# Patient Record
Sex: Male | Born: 1955 | Race: White | Hispanic: No | Marital: Married | Smoking: Never smoker
Health system: Southern US, Community
[De-identification: ages and names within clinical notes are randomized; demographics above are authoritative.]

## PROBLEM LIST (undated history)

## (undated) DIAGNOSIS — Z87442 Personal history of urinary calculi: Secondary | ICD-10-CM

## (undated) DIAGNOSIS — E039 Hypothyroidism, unspecified: Secondary | ICD-10-CM

## (undated) DIAGNOSIS — E785 Hyperlipidemia, unspecified: Secondary | ICD-10-CM

## (undated) DIAGNOSIS — E559 Vitamin D deficiency, unspecified: Secondary | ICD-10-CM

## (undated) DIAGNOSIS — E119 Type 2 diabetes mellitus without complications: Secondary | ICD-10-CM

## (undated) DIAGNOSIS — E782 Mixed hyperlipidemia: Secondary | ICD-10-CM

## (undated) DIAGNOSIS — F419 Anxiety disorder, unspecified: Secondary | ICD-10-CM

## (undated) DIAGNOSIS — Z860100 Personal history of colon polyps, unspecified: Secondary | ICD-10-CM

## (undated) DIAGNOSIS — G20A1 Parkinson's disease without dyskinesia, without mention of fluctuations: Secondary | ICD-10-CM

## (undated) DIAGNOSIS — M1711 Unilateral primary osteoarthritis, right knee: Secondary | ICD-10-CM

## (undated) DIAGNOSIS — G20 Parkinson's disease: Secondary | ICD-10-CM

## (undated) HISTORY — DX: Personal history of colonic polyps: Z86.010

## (undated) HISTORY — DX: Hypothyroidism, unspecified: E03.9

## (undated) HISTORY — DX: Type 2 diabetes mellitus without complications: E11.9

## (undated) HISTORY — DX: Mixed hyperlipidemia: E78.2

## (undated) HISTORY — DX: Personal history of colon polyps, unspecified: Z86.0100

## (undated) HISTORY — DX: Parkinson's disease: G20

## (undated) HISTORY — DX: Hyperlipidemia, unspecified: E78.5

## (undated) HISTORY — DX: Unilateral primary osteoarthritis, right knee: M17.11

## (undated) HISTORY — DX: Parkinson's disease without dyskinesia, without mention of fluctuations: G20.A1

## (undated) HISTORY — DX: Vitamin D deficiency, unspecified: E55.9

---

## 1994-11-08 HISTORY — PX: RHINOPLASTY: SUR1284

## 1998-11-08 HISTORY — PX: CARPAL TUNNEL RELEASE: SHX101

## 2003-11-09 HISTORY — PX: HEMORRHOID SURGERY: SHX153

## 2004-11-08 HISTORY — PX: KNEE SURGERY: SHX244

## 2004-11-08 HISTORY — PX: ROTATOR CUFF REPAIR: SHX139

## 2007-11-09 HISTORY — PX: ROTATOR CUFF REPAIR: SHX139

## 2010-11-08 HISTORY — PX: TESTICLE REMOVAL: SHX68

## 2013-11-08 HISTORY — PX: HEMORRHOID SURGERY: SHX153

## 2014-06-26 DIAGNOSIS — M4722 Other spondylosis with radiculopathy, cervical region: Secondary | ICD-10-CM

## 2014-06-26 HISTORY — DX: Other spondylosis with radiculopathy, cervical region: M47.22

## 2019-08-29 ENCOUNTER — Other Ambulatory Visit: Payer: Self-pay | Admitting: Neurosurgery

## 2019-08-29 DIAGNOSIS — M5412 Radiculopathy, cervical region: Secondary | ICD-10-CM

## 2019-09-11 ENCOUNTER — Other Ambulatory Visit: Payer: Self-pay

## 2019-10-09 ENCOUNTER — Ambulatory Visit
Admission: RE | Admit: 2019-10-09 | Discharge: 2019-10-09 | Disposition: A | Discharge status: Home / Self Care | Payer: BC Managed Care – PPO | Source: Ambulatory Visit | Attending: Family Medicine | Admitting: Family Medicine

## 2019-10-09 ENCOUNTER — Ambulatory Visit
Admission: RE | Admit: 2019-10-09 | Discharge: 2019-10-09 | Disposition: A | Discharge status: Home / Self Care | Payer: BC Managed Care – PPO | Source: Ambulatory Visit | Attending: Neurosurgery | Admitting: Neurosurgery

## 2019-10-09 ENCOUNTER — Other Ambulatory Visit: Payer: Self-pay | Admitting: Family Medicine

## 2019-10-09 DIAGNOSIS — Z77018 Contact with and (suspected) exposure to other hazardous metals: Secondary | ICD-10-CM

## 2019-10-09 DIAGNOSIS — M5412 Radiculopathy, cervical region: Secondary | ICD-10-CM

## 2019-10-09 IMAGING — CR DG ORBITS FOR FOREIGN BODY
2 series · 2 of 2 positions shown · non-contrast
Comparison: None.

CLINICAL DATA: Metal working/exposure; clearance prior to MRI

EXAM:
ORBITS FOR FOREIGN BODY - 2 VIEW

[w orbit pa (1 of 2)]
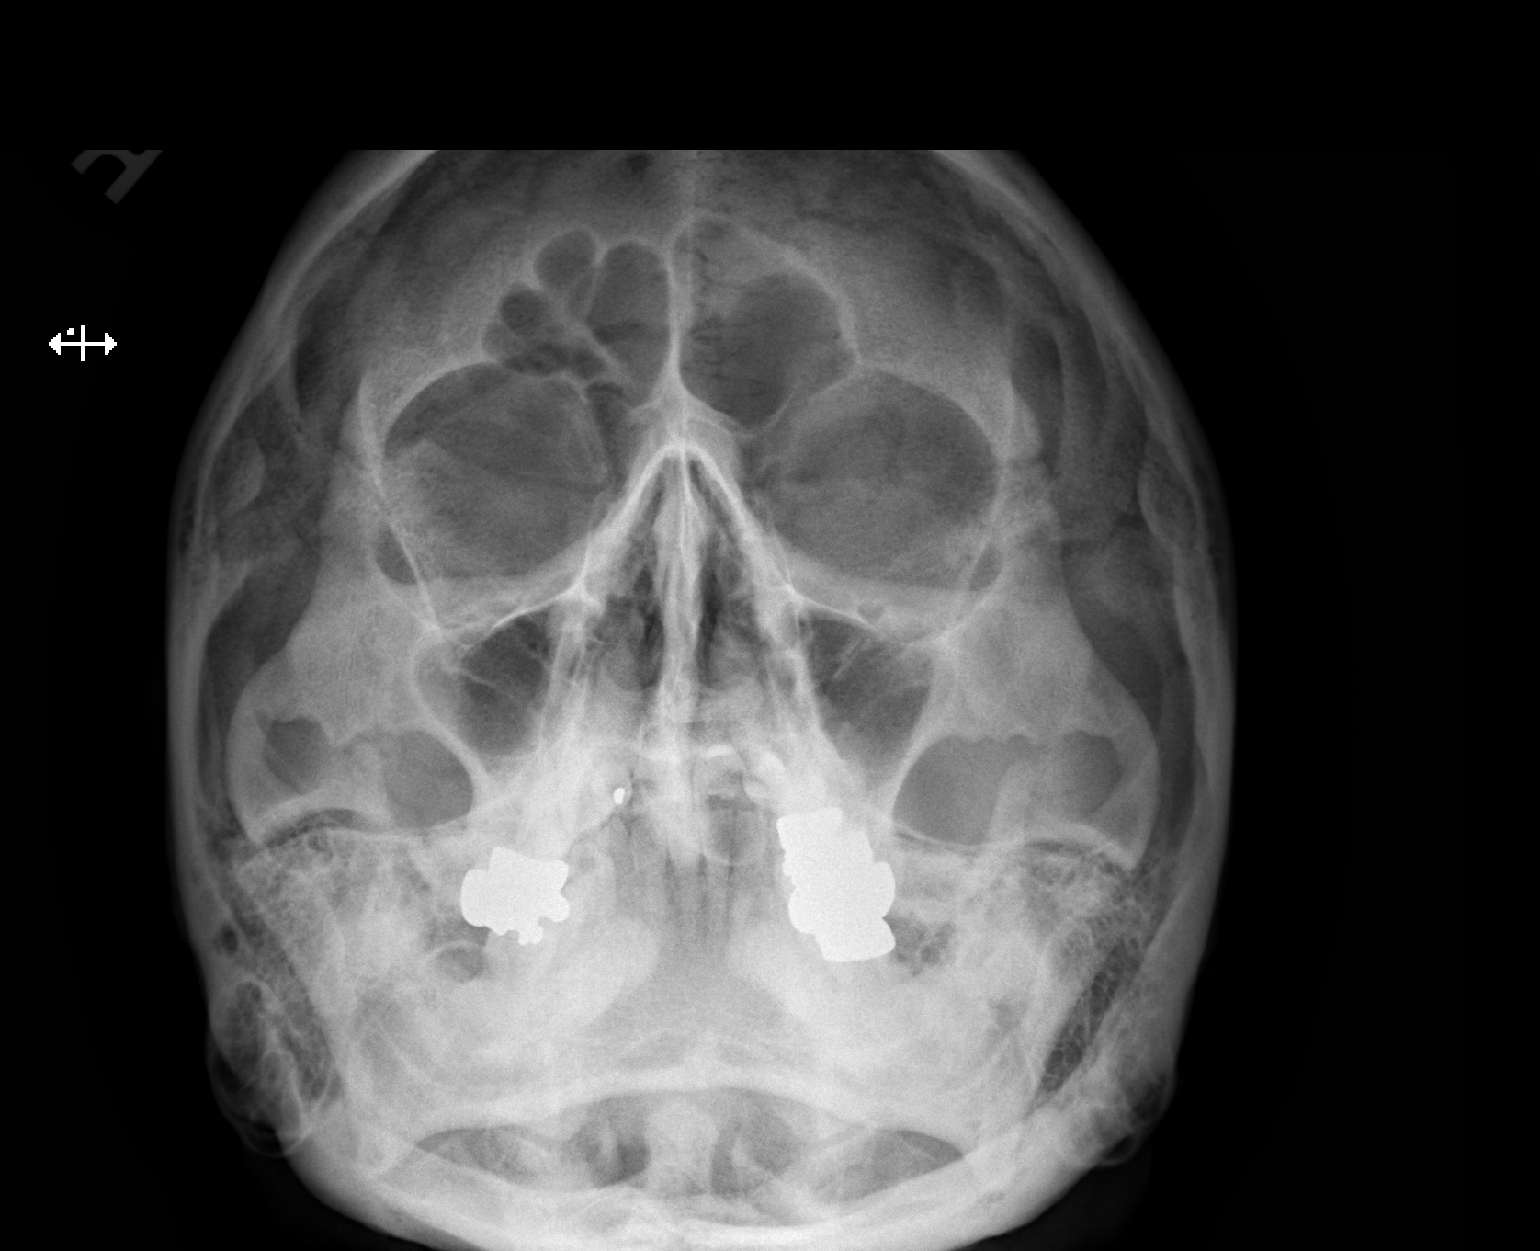

[w orbit pa (2 of 2)]
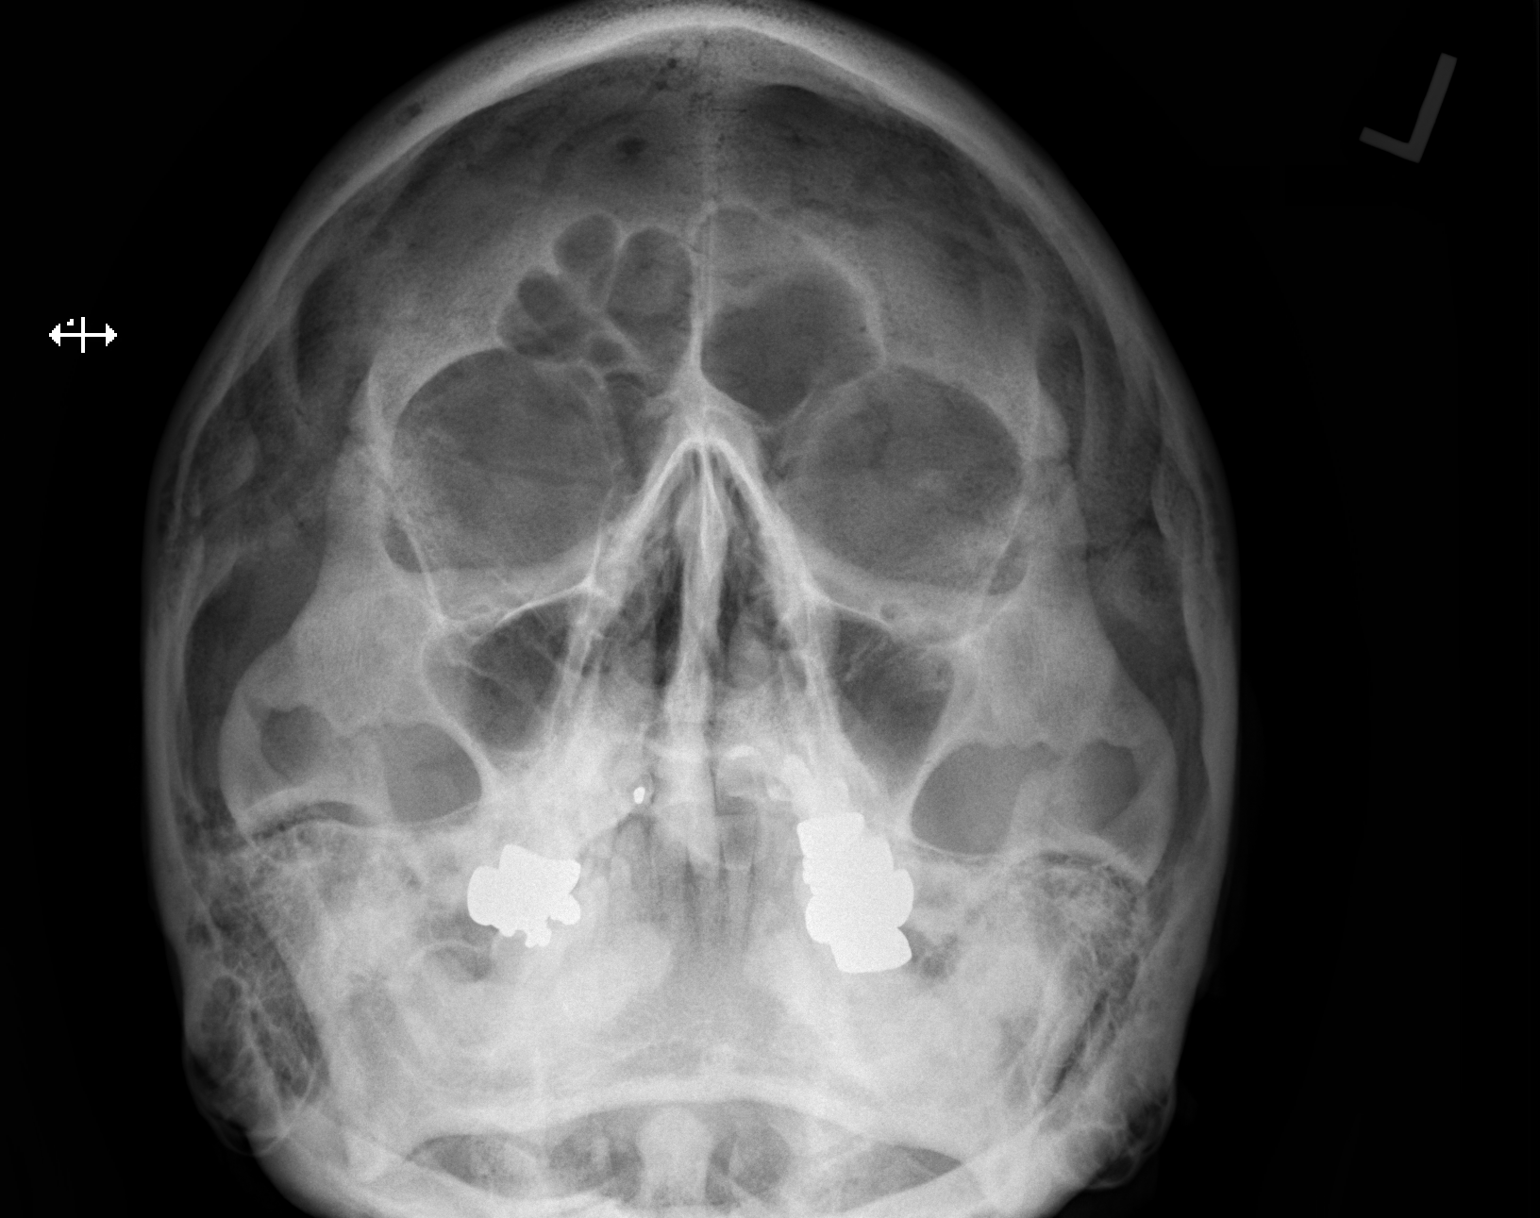

[2 of 2 positions shown; findings below may reference images not displayed]

FINDINGS: Water's views with eyes deviated toward the left and toward the
right were obtained. No intraorbital radiopaque foreign body.
Paranasal sinuses clear. No fracture or dislocation.
IMPRESSION: No evidence of metallic foreign body within the orbits.

## 2019-11-09 DIAGNOSIS — U071 COVID-19: Secondary | ICD-10-CM

## 2019-11-09 HISTORY — PX: REPLACEMENT TOTAL KNEE: SUR1224

## 2019-11-09 HISTORY — DX: COVID-19: U07.1

## 2020-05-21 DIAGNOSIS — R030 Elevated blood-pressure reading, without diagnosis of hypertension: Secondary | ICD-10-CM | POA: Insufficient documentation

## 2020-05-22 DIAGNOSIS — G20 Parkinson's disease: Secondary | ICD-10-CM | POA: Insufficient documentation

## 2020-05-22 DIAGNOSIS — M4802 Spinal stenosis, cervical region: Secondary | ICD-10-CM

## 2020-05-22 DIAGNOSIS — M542 Cervicalgia: Secondary | ICD-10-CM

## 2020-05-22 HISTORY — DX: Spinal stenosis, cervical region: M48.02

## 2020-05-22 HISTORY — DX: Cervicalgia: M54.2

## 2020-06-09 ENCOUNTER — Encounter: Payer: Self-pay | Admitting: Neurology

## 2020-06-16 NOTE — Progress Notes (Signed)
Assessment/Plan:    1.  Parkinsons Disease  -Did discuss with the patient nature and pathophysiology.  Discussed with patient and his wife the importance of taking medication on a regular schedule.  Also discussed with the patient the importance of having a regular daily schedule  -Discontinue carbidopa/levodopa 25/250 (patient only taking it twice per day anyway) given nausea.  Discontinue extra carbidopa  -Start carbidopa/levodopa 25/100 CR, 2 tablets 3 times per day at 7 AM/11 AM/4 PM  -Start pramipexole and work to pramipexole ER, 1.5 mg daily.  Discussed extensively risk, benefits, and side effects including but not limited to sleep attacks and compulsive behaviors.  -Discussed DBS surgeries in detail with the patient.  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  He does state that levodopa was helpful in the beginning.  -Discussed importance of exercise.  2.  REM behavior disorder  -This is commonly associated with PD and the patient is experiencing this.  We discussed that this can be very serious and even harmful.  We talked about medications as well as physical barriers to put in the bed (particularly soft bed rails, pillow barriers).  We talked about moving the night stand so that it is not so close to the side of the bed.  3.  Follow-up next 4 to 6 months. Subjective:   Maxwell Tate was seen today in the movement disorders clinic for neurologic consultation at the request of Maeola Harman, MD.  The consultation is for the evaluation of Parkinson's disease.  Medical records from his prior neurologist have been reviewed.  Patient currently under the care of [facility] neurology in [place], [place].  The first notes that I have are from March, 2019.  He presented to the neurologist with a 33-month history of right hand tremor.  He was diagnosed with Parkinson's disease and  started on levodopa at that visit.  He followed up in April, 2019 and reports indicate that he had "no response to carbidopa/levodopa 25/100" (it was clear that the no response was in regards to tremor), so his dosage was subsequently increased.  Patient followed up and again a month and again found that he had continued tremor, and ultimately the physician decided to increase his medication to carbidopa/levodopa 25/250, 1 tablet 3 times per day.  In December, 2019, entacapone was added.  He began to have nausea ultimately with this combination of medications and extra carbidopa was added.  By November, 2020, the patient changed from Dr. Albertina Parr to Dr. Aquilla Solian.  Patient was last seen on February 06, 2020.  Pt admits poor compliance with meds  Current movement disorder medications: Carbidopa 25 mg every morning Carbidopa/levodopa 25/250, 1 tablet 4 times per day (admits only taking bid) Entacapone 200 mg, 1 tablet 3 times per day  Melatonin, 10 mg at bedtime   Specific Symptoms:  Tremor: Yes.  , R arm and rarely on the L arm (just few times over the last few years) Family hx of similar:  Yes.  , maternal GF with Parkinsons Disease  Voice: yes, weaker Sleep: trouble getting back to sleep after using RR.  Vivid Dreams:  Yes.    Acting out dreams:  Yes.   - "I wake up fighting" Wet Pillows: Yes.   Postural symptoms:  Yes.    Falls?  Yes.   - one time stepping over an electric fence Bradykinesia symptoms:  shuffling gait, slow movements and difficulty getting out of a chair Loss of smell:  Yes.   Loss of taste:  No. Urinary Incontinence:  No. Difficulty Swallowing:  No. Handwriting, micrographia: Yes.   Trouble with ADL's:  No.  Trouble buttoning clothing: No. Depression:  No. but admits to anxiety and panic Memory changes:  No. Hallucinations:  No.  visual distortions: No. N/V:  No. Lightheaded:  No.  Syncope: No. Diplopia:  No. Dyskinesia:  No. Prior exposure to  reglan/antipsychotics: No.  PREVIOUS MEDICATIONS: Sinemet  ALLERGIES:  No Known Allergies  CURRENT MEDICATIONS:  Current Outpatient Medications  Medication Instructions  . atorvastatin (LIPITOR) 40 mg, Oral, Daily  . carbidopa-levodopa (SINEMET IR) 25-250 MG tablet 1 tablet, Oral, 4 times daily  . cholecalciferol (VITAMIN D3) 1,000 Units, Oral, 2 times daily  . diclofenac (VOLTAREN) 50 mg, Oral, 2 times daily  . entacapone (COMTAN) 200 mg, Oral, 4 times daily  . levothyroxine (SYNTHROID) 50 mcg, Oral, Daily before breakfast  . Melatonin 10 MG TABS 1 tablet, Oral, Daily at bedtime, Take additional 5mg  of melatonin   . melatonin 5 mg, Oral, Daily at bedtime, Takes with 10mg  tablet qhs   . Multiple Vitamin (MULTIVITAMIN WITH MINERALS) TABS tablet 1 tablet, Oral, Daily  . Multiple Vitamins-Minerals (OCUVITE PRESERVISION PO) 1 tablet, Oral, 2 times daily  . sitaGLIPtin-metformin (JANUMET) 50-1000 MG tablet 2 tablets, Oral, Daily at bedtime    Objective:   VITALS:   Vitals:   06/18/20 1015  BP: 137/84  Pulse: 69  SpO2: 95%  Weight: 209 lb (94.8 kg)  Height: 5\' 10"  (1.778 m)    GEN:  The patient appears stated age and is in NAD. HEENT:  Normocephalic, atraumatic.  The mucous membranes are moist. The superficial temporal arteries are without ropiness or tenderness. CV:  RRR Lungs:  CTAB Neck/HEME:  There are no carotid bruits bilaterally.  Neurological examination:  Orientation: The patient is alert and oriented x3.  Cranial nerves: There is good facial symmetry. Extraocular muscles are intact. The visual fields are full to confrontational testing. The speech is fluent and clear. Soft palate rises symmetrically and there is no tongue deviation. Hearing is intact to conversational tone. Sensation: Sensation is intact to light and pinprick throughout (facial, trunk, extremities). Vibration is intact at the bilateral big toe. There is no extinction with double simultaneous  stimulation. There is no sensory dermatomal level identified. Motor: Strength is 5/5 in the bilateral upper and lower extremities.   Shoulder shrug is equal and symmetric.  There is no pronator drift. Deep tendon reflexes: Deep tendon reflexes are 2/4 at the bilateral biceps, triceps, brachioradialis, patella and achilles. Plantar responses are downgoing bilaterally.  Pt did NOT take carbidopa/levodopa today  Movement examination: Tone: There is mild increased tone in the LUE.   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 The patient's stride length is good with decreased arm swing on the right.    Total time spent on today's visit was 60 minutes, including both face-to-face time and nonface-to-face time.  Time included that spent on review  of records (prior notes available to me/labs/imaging if pertinent), discussing treatment and goals, answering patient's questions and coordinating care.  Cc:  Blanchard Kelch, FNP

## 2020-06-18 ENCOUNTER — Ambulatory Visit: Payer: BC Managed Care – PPO | Admitting: Neurology

## 2020-06-18 ENCOUNTER — Encounter: Payer: Self-pay | Admitting: Neurology

## 2020-06-18 ENCOUNTER — Other Ambulatory Visit: Payer: Self-pay

## 2020-06-18 VITALS — BP 137/84 | HR 69 | Ht 70.0 in | Wt 209.0 lb

## 2020-06-18 DIAGNOSIS — G20 Parkinson's disease: Secondary | ICD-10-CM | POA: Insufficient documentation

## 2020-06-18 DIAGNOSIS — R110 Nausea: Secondary | ICD-10-CM | POA: Diagnosis not present

## 2020-06-18 MED ORDER — CARBIDOPA-LEVODOPA ER 25-100 MG PO TBCR: 2.0000 | EXTENDED_RELEASE_TABLET | Freq: Three times a day (TID) | ORAL | 1 refills | Status: DC

## 2020-06-18 MED ORDER — PRAMIPEXOLE DIHYDROCHLORIDE 0.125 MG PO TABS
ORAL_TABLET | ORAL | 0 refills | Status: DC
Start: 2020-06-18 — End: 2020-12-03

## 2020-06-18 MED ORDER — PRAMIPEXOLE DIHYDROCHLORIDE ER 1.5 MG PO TB24: 1.0000 | ORAL_TABLET | Freq: Every day | ORAL | 1 refills | Status: DC

## 2020-06-18 NOTE — Patient Instructions (Signed)
1.  Stop carbidopa/levodopa 25/250 2.  Start carbidopa/levodopa 25/100 CR, 2 at 7am, 2 at 11am, 2 at 4pm.   As a reminder, carbidopa/levodopa can be taken at the same time as a carbohydrate, but we like to have you take your pill either 30 minutes before a protein source or 1 hour after as protein can interfere with carbidopa/levodopa absorption. 3.  Start mirapex (pramipexole) as follows:  0.125 mg - 1 tablet three times per day for a week, then 2 tablets three times per day for a week and then fill the pramipexole ER, 1.5 mg daily.  This does come in a 3 time per day dose if it is too expensive 4.  You need to have a regular daily schedule with exercise!  The physicians and staff at [facility] are committed to providing excellent care. You may receive a survey requesting feedback about your experience at our office. We strive to receive "very good" responses to the survey questions. If you feel that your experience would prevent you from giving the office a "very good " response, please contact our office to try to remedy the situation. We may be reached at [phone]. Thank you for taking the time out of your busy day to complete the survey.

## 2020-07-07 MED ORDER — ENTACAPONE 200 MG PO TABS: ORAL_TABLET | ORAL | 1 refills | Status: DC

## 2020-08-16 ENCOUNTER — Other Ambulatory Visit: Payer: Self-pay | Admitting: Neurology

## 2020-08-20 NOTE — Telephone Encounter (Signed)
Rx(s) sent to pharmacy electronically.  

## 2020-12-02 NOTE — Progress Notes (Signed)
Assessment/Plan:   1.  Parkinsons Disease  -Continue carbidopa/levodopa 25/100 CR, 2 tablets at 7 AM/11 AM/4 PM.  Needs to take medication faithfully  -Continue entacapone 200 mg, 1 tablet with each levodopa dose  -Continue pramipexole ER, 1.5 mg daily  -discussed importance of exercise  -We discussed that it used to be thought that levodopa would increase risk of melanoma but now it is believed that Parkinsons itself likely increases risk of melanoma. he is to get regular skin checks.  He sees dermatology yearly  2.  RBD  -Safety discussed.  He doesn't want medication for that. Subjective:   Maxwell Tate was seen today in follow up for Parkinsons disease.  My previous records were reviewed prior to todays visit as well as outside records available to me.  Pt with wife who supplements the history.  We started him on pramipexole last visit and reworked his levodopa, primarily because of nausea.  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  He has had no compulsive behaviors or sleep attacks.  He reports that the medications are working but admits doesn't take them faithfully - "if I take them, they work."  pt denies falls.  Pt with occasional lightheadedness, but near syncope.  No hallucinations.  Mood has been good.  He is screaming out at night.  Occasional facial paresthesias - entire face.  Has appt with Dr Venetia Maxon to discuss.  Current prescribed movement disorder medications: Carbidopa/levodopa 25/100 CR, 2 tablets at 7 AM/11 AM/4 PM (started last visit instead of carbidopa/levodopa 25/250) - admits inconsisent with that Pramipexole ER, 1.5 mg daily (started last visit) Entacapone, 200 mg, 1 tablet 3 times per day  PREVIOUS MEDICATIONS: Carbidopa/levodopa 25/250  ALLERGIES:  No Known Allergies  CURRENT MEDICATIONS:  Outpatient Encounter Medications as of 12/03/2020  Medication Sig  . atorvastatin (LIPITOR) 40 MG tablet Take 40 mg by mouth daily.  . Carbidopa-Levodopa ER (SINEMET CR)  25-100 MG tablet controlled release Take 2 tablets by mouth in the morning, at noon, and at bedtime. (Patient taking differently: Take 2 tablets by mouth in the morning, at noon, and at bedtime. Takes 3-4 times a day sometimes only takes twice a day)  . cholecalciferol (VITAMIN D3) 25 MCG (1000 UNIT) tablet Take 1,000 Units by mouth in the morning and at bedtime.  . diclofenac (VOLTAREN) 50 MG EC tablet Take 50 mg by mouth 2 (two) times daily. Takes qd  . entacapone (COMTAN) 200 MG tablet 1 tablet at 7am/11am/4pm with levodopa dose  . levothyroxine (SYNTHROID) 50 MCG tablet Take 50 mcg by mouth daily before breakfast.  . Melatonin 10 MG TABS Take 1 tablet by mouth at bedtime. Take additional 5mg  of melatonin  . melatonin 5 MG TABS Take 5 mg by mouth at bedtime. Takes with 10mg  tablet qhs  . Multiple Vitamin (MULTIVITAMIN WITH MINERALS) TABS tablet Take 1 tablet by mouth daily.  . Multiple Vitamins-Minerals (OCUVITE PRESERVISION PO) Take 1 tablet by mouth in the morning and at bedtime.  . Pramipexole Dihydrochloride 1.5 MG TB24 Take 1 tablet (1.5 mg total) by mouth daily.  . sitaGLIPtin-metformin (JANUMET) 50-1000 MG tablet Take 2 tablets by mouth at bedtime.  . [DISCONTINUED] pramipexole (MIRAPEX) 0.125 MG tablet 1 tablet three times per day for a week, then 2 po three times per day for a week (Patient not taking: Reported on 12/03/2020)   No facility-administered encounter medications on file as of 12/03/2020.    Objective:   PHYSICAL EXAMINATION:    VITALS:  Vitals:   12/03/20 0831  BP: 102/72  Pulse: 60  SpO2: 98%  Weight: 221 lb (100.2 kg)  Height: 5\' 10"  (1.778 m)    GEN:  The patient appears stated age and is in NAD. HEENT:  Normocephalic, atraumatic.  The mucous membranes are moist. The superficial temporal arteries are without ropiness or tenderness. CV:  RRR Lungs:  CTAB Neck/HEME:  There are no carotid bruits bilaterally.  Neurological examination:  Orientation: The  patient is alert and oriented x3. Cranial nerves: There is good facial symmetry with mild facial hypomimia. The speech is fluent and clear. Soft palate rises symmetrically and there is no tongue deviation. Hearing is intact to conversational tone. Sensation: Sensation is intact to light touch throughout Motor: Strength is at least antigravity x4.  Movement examination: Tone: There is mild increased tone in the LUE.   Abnormal movements: there is near constant mod amplitude LUE rest tremor Coordination:  There is  decremation with RAM's, with any form of RAMS, including alternating supination and pronation of the forearm, hand opening and closing, finger taps, heel taps and toe taps, on the L, overally mild Gait and Station: The patient has mild difficulty arising out of a deep-seated chair without the use of the hands     Total time spent on today's visit was 30 minutes, including both face-to-face time and nonface-to-face time.  Time included that spent on review of records (prior notes available to me/labs/imaging if pertinent), discussing treatment and goals, answering patient's questions and coordinating care.  Cc:  , FNP

## 2020-12-03 ENCOUNTER — Encounter: Payer: Self-pay | Admitting: Neurology

## 2020-12-03 ENCOUNTER — Ambulatory Visit (INDEPENDENT_AMBULATORY_CARE_PROVIDER_SITE_OTHER): Payer: Medicare Other | Admitting: Neurology

## 2020-12-03 ENCOUNTER — Other Ambulatory Visit: Payer: Self-pay

## 2020-12-03 VITALS — BP 102/72 | HR 60 | Ht 70.0 in | Wt 221.0 lb

## 2020-12-03 DIAGNOSIS — G20 Parkinson's disease: Secondary | ICD-10-CM

## 2020-12-03 NOTE — Patient Instructions (Addendum)
Take your medication regularly and on time.  See your dermatologist.    Increase exercise!  Online Resources for [employer] over Parkinson's Group January 2022  . Local [place] Online Groups  o Power over [employer] Group :   - [employer] Over Parkinson's Patient Education Group will be Wednesday, January 12th at 2pm via [place].   - Upcoming Power over Parkinson's Meetings:  2nd Wednesdays of the month at 2 pm:       February 9th, March 9th - Contact Amy Marriott at [email or web address] if interested in participating in this online group o Parkinson's Care Partners Group:    3rd Mondays, Contact Lynwood Dawley o Atypical Parkinsonian Patient Group:   4th Wednesdays, Contact Lynwood Dawley o If you are interested in participating in these online groups with Maralyn Sago, please contact her directly for how to join those meetings.  Her contact information is [email or web address].  She will send you a link to join the [employer].  (Please note that Lynwood Dawley , MSW, LCSW, has resigned her position at [facility], but will continue to lead the online groups temporarily)  . Parkinson Foundation:  [email or web address] [email or web address] o PD Health at Home continues:  Mindfulness Mondays, Expert Briefing Tuesdays, Wellness Wednesdays, Take Time Thursdays, Fitness Fridays -Listings for January 2022 are on the website o Upcoming Webinar:  Sights, Sounds, and Parkinson's.  Wednesday, December 10, 2020, @ 1 pm  o Please check out their website to sign up for emails and see their full online offerings  . [employer] Foundation:  www.[email or web address]  o Upcoming Webinar:   Diet, Exercise, and other Strategies for Living Well as you Age.  Thursday, November 29, 2020 @ 12 noon o Check out additional information on their website to see their full online offerings  . [employer] Foundation:  www.[email or web address] o Upcoming Webinar:  Emerging Therapies in Parkinson's with Dr. Jefferey Pica.  Wednesday,  December 03, 2020 @ 2 pm o Care Partner Monthly Meetup.  With Jillene Bucks Ph[place]ney.  First Tuesday of each month, 2 pm o Check out additional information to Live Well Today on their website  . Parkinson and Movement Disorders (PMD) Alliance:  www.pmdalliance.org o [employer] Online:  Online Education Events o Sign up for emails, which are sent weekly to give you updates on programming and online offerings  . Parkinson's Association of the [place]:  www.parkinsonassociation.org o Information on online support groups, online exercises including Yoga, Parkinson's exercises and more-LOTS of information on links to PD resources and online events o Virtual Support Group through Parkinson's Association of the [place]; next one is scheduled for Wednesday, December 10, 2020 at 2 pm. (These are typically scheduled for the 1st Wednesday of the month at 2 pm).  Visit website for details.  . Additional links for movement activities: o PWR! Moves Classes at [facility] Exercise Room HAD RESUMED (but are ON HOLD DUE TO COVID in January)  Contact Amy Hillman, PT [email or web address] or [phone] if interested o Here is a link to the PWR!Moves classes on Zoom from [place] - Daily Mon-Sat at 10:00. Via Zoom, FREE and open to all.  There is also a link below via Facebook if you use that platform. - [email or web address] - [email or web address] o Parkinson's Wellness Recovery (PWR! Moves)  www.pwr4life.org - Info on the PWR! Virtual Experience:  You will have access to our expertise through self-assessment, guided plans that start with the PD-specific fundamentals, educational content, tips, Q&A with an expert, and a growing Engineering geologist of PD-specific pre-recorded and  live exercise classes of varying types  and intensity - both physical and cognitive! If that is not enough, we offer 1:1 wellness consultations (in-person or virtual) to personalize your PWR! Dance movement psychotherapist.  - Check out the PWR! Move of the month on the Parkinson Wellness Recovery website:  [email or web address].[email or web address] o [employer]  Fridays:  - As part of the PD Health @ Home program, this free video series focuses each week on one aspect of fitness designed to support people living with Parkinson's.  -  [email or web address].morris.[email or web address] o Dance for PD website is offering free, live-stream classes throughout the week, as well as links to [employer] of classes:  [email or web address] o Dance for Parkinson's Class:  Dance Project of [place].  Free offering for people with Parkinson's and care partners; virtual class.  o For more information, contact [phone] or email Allena Napoleon at [email or web address] o Virtual dance and Pilates for Parkinson's classes: Click on the Community Tab> Parkinson's Movement Initiative Tab.  To register for classes and for more information, visit www.[email or web address].co.za and click the "community" tab.  o [employer] Parkinson's Cycling Classes  - [employer]: 1pm on Fridays-Live classes at [facility] [employer] at [email or web address] or [phone]) [employer]: Virtual Classes Mondays and Thursdays (contact Aquilla at [email or web address] or [phone])

## 2020-12-09 ENCOUNTER — Ambulatory Visit: Payer: BC Managed Care – PPO | Admitting: Neurology

## 2020-12-25 ENCOUNTER — Other Ambulatory Visit: Payer: Self-pay | Admitting: Neurology

## 2020-12-25 NOTE — Telephone Encounter (Signed)
Rx(s) sent to pharmacy electronically.  

## 2021-01-04 ENCOUNTER — Other Ambulatory Visit: Payer: Self-pay | Admitting: Neurology

## 2021-01-27 ENCOUNTER — Other Ambulatory Visit: Payer: Self-pay | Admitting: Neurology

## 2021-01-29 DIAGNOSIS — Z6832 Body mass index (BMI) 32.0-32.9, adult: Secondary | ICD-10-CM | POA: Insufficient documentation

## 2021-03-27 ENCOUNTER — Other Ambulatory Visit: Payer: Self-pay | Admitting: Neurology

## 2021-06-03 NOTE — Progress Notes (Signed)
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  Needs to take medication faithfully  -Continue entacapone 200 mg, 1 tablet with each levodopa dose  -Continue pramipexole ER, 1.5 mg daily  -proud of him for exercising.    -hes interested in dbs but doesn't have tremor on the left but does have some mild decremation/bradykinesia.  Discussed r/b of dbs and reasons why/why not to do it.  Discussed that its going to be hard to test L side in the OR but maybe he will have some sx's once off of med.  He feels he is frustrated by tremor, cannot hunt and wants to pursue dbs.    -will schedule neurocog testing.  -will schedule on/off test.   2.  RBD  -Safety discussed.  He still doesn't want medication for that. Subjective:   Maxwell Tate was seen today in follow up for Parkinsons disease.  My previous records were reviewed prior to todays visit as well as outside records available to me.  Pt with wife who supplements the history.  No falls since last visit with the exceptio of rolling out of the bed a few nights ago and rolled onto the nightstand.  No lightheadedness or near syncope.  Saw Dr. Venetia Maxon on January 29, 2021 and the notes available to me are reviewed.  Felt that he had occipital neuralgia.  He is going to the gym/trainer now 3 days a week.  Current prescribed movement disorder medications: Carbidopa/levodopa 25/100 CR, 2 tablets at 7 AM/11 AM/4 PM (pt admits that has trouble taking tid and we have already reduced from qid - often getting bid) Pramipexole ER, 1.5 mg daily  Entacapone, 200 mg, 1 tablet 3 times per day  PREVIOUS MEDICATIONS: Carbidopa/levodopa 25/250  ALLERGIES:  No Known Allergies  CURRENT MEDICATIONS:  Outpatient Encounter Medications as of 06/05/2021  Medication Sig   atorvastatin (LIPITOR) 40 MG tablet Take 40 mg by mouth daily.   Carbidopa-Levodopa ER  (SINEMET CR) 25-100 MG tablet controlled release TAKE 2 TABS AT 7AM/ 11AM AND 4PM   cholecalciferol (VITAMIN D3) 25 MCG (1000 UNIT) tablet Take 1,000 Units by mouth in the morning and at bedtime.   diclofenac (VOLTAREN) 50 MG EC tablet Take 50 mg by mouth 2 (two) times daily. Takes qd   entacapone (COMTAN) 200 MG tablet 1 TABLET AT 7AM/11AM/4PM WITH LEVODOPA DOSE   levothyroxine (SYNTHROID) 50 MCG tablet Take 50 mcg by mouth daily before breakfast.   Melatonin 10 MG TABS Take 1 tablet by mouth at bedtime. Take additional 5mg  of melatonin   melatonin 5 MG TABS Take 5 mg by mouth at bedtime. Takes with 10mg  tablet qhs   Multiple Vitamin (MULTIVITAMIN WITH MINERALS) TABS tablet Take 1 tablet by mouth daily.   Multiple Vitamins-Minerals (OCUVITE PRESERVISION PO) Take 1 tablet by mouth in the morning and at bedtime.   MYRBETRIQ 25 MG TB24 tablet Take 25 mg by mouth daily.   Pramipexole Dihydrochloride 1.5 MG TB24 TAKE 1 TABLET (1.5 MG TOTAL) BY MOUTH DAILY.   sitaGLIPtin-metformin (JANUMET) 50-1000 MG tablet Take 1 tablet by mouth at bedtime.   No facility-administered encounter medications on file as of 06/05/2021.    Objective:   PHYSICAL EXAMINATION:    VITALS:   Vitals:   06/05/21 1039  BP: 98/68  Pulse: 62  SpO2: 96%  Weight: 220 lb (99.8 kg)  Height: 5\' 9"  (  1.753 m)     GEN:  The patient appears stated age and is in NAD. HEENT:  Normocephalic, atraumatic.  The mucous membranes are moist. The superficial temporal arteries are without ropiness or tenderness. CV:  RRR Lungs:  CTAB Neck/HEME:  There are no carotid bruits bilaterally.  Neurological examination:  Orientation: The patient is alert and oriented x3. Cranial nerves: There is good facial symmetry with mild facial hypomimia. The speech is fluent and clear. Soft palate rises symmetrically and there is no tongue deviation. Hearing is intact to conversational tone. Sensation: Sensation is intact to light touch  throughout Motor: Strength is at least antigravity x4.  Movement examination: Tone: There is normal tone bilaterally today Abnormal movements: there is near constant mod amplitude RUE rest tremor, increases with distraction.  None on the L (documented on wrong side previously) Coordination:  There is mild decremation with finger taps bilaterally Gait and Station: The patient has mild difficulty arising out of a deep-seated chair without the use of the hands.  Ambulates well.      Total time spent on today's visit was 47 minutes, including both face-to-face time and nonface-to-face time.  Time included that spent on review of records (prior notes available to me/labs/imaging if pertinent), discussing treatment and goals, answering patient's questions and coordinating care.  Cc:  Blanchard Kelch, FNP

## 2021-06-05 ENCOUNTER — Other Ambulatory Visit: Payer: Self-pay

## 2021-06-05 ENCOUNTER — Encounter: Payer: Self-pay | Admitting: Neurology

## 2021-06-05 ENCOUNTER — Ambulatory Visit: Payer: Medicare Other | Admitting: Neurology

## 2021-06-05 VITALS — BP 98/68 | HR 62 | Ht 69.0 in | Wt 220.0 lb

## 2021-06-05 DIAGNOSIS — R413 Other amnesia: Secondary | ICD-10-CM | POA: Diagnosis not present

## 2021-06-05 DIAGNOSIS — G20 Parkinson's disease: Secondary | ICD-10-CM | POA: Diagnosis not present

## 2021-06-05 NOTE — Patient Instructions (Addendum)
HOLD your pramipexole ER 24 hours prior to your on/off test (aka levodopa challenge) Hold your carbidopa/levodopa 25/100 and your entacapone the AM of the on/off test (don't take any that day)  You have been referred for a neurocognitive evaluation (i.e., evaluation of memory and thinking abilities). Please bring someone with you to this appointment if possible, as it is helpful for the neuropsychologist to hear from both you and another adult who knows you well. Please bring eyeglasses and hearing aids if you wear them.    The evaluation will take approximately 3 hours and has two parts:   The first part is a clinical interview with the neuropsychologist, Dr. Milbert Coulter or Dr. Roseanne Reno. During the interview, the neuropsychologist will speak with you and the individual you brought to the appointment.    The second part of the evaluation is testing with the doctor's technician, aka psychometrician, Annabelle Harman or [employer]. During the testing, the technician will ask you to remember different types of material, solve problems, and answer some questionnaires. Your family member will not be present for this portion of the evaluation.   Please note: We have to reserve several hours of the neuropsychologist's time and the psychometrician's time for your evaluation appointment. As such, there is a No-Show fee of $100. If you are unable to attend any of your appointments, please contact our office as soon as possible to reschedule.

## 2021-06-26 ENCOUNTER — Encounter: Payer: Self-pay | Admitting: Psychology

## 2021-06-26 ENCOUNTER — Ambulatory Visit (INDEPENDENT_AMBULATORY_CARE_PROVIDER_SITE_OTHER): Payer: Medicare Other | Admitting: Psychology

## 2021-06-26 ENCOUNTER — Ambulatory Visit: Payer: Medicare Other | Admitting: Psychology

## 2021-06-26 ENCOUNTER — Other Ambulatory Visit: Payer: Self-pay

## 2021-06-26 DIAGNOSIS — E782 Mixed hyperlipidemia: Secondary | ICD-10-CM | POA: Insufficient documentation

## 2021-06-26 DIAGNOSIS — E559 Vitamin D deficiency, unspecified: Secondary | ICD-10-CM | POA: Insufficient documentation

## 2021-06-26 DIAGNOSIS — Z860100 Personal history of colon polyps, unspecified: Secondary | ICD-10-CM | POA: Insufficient documentation

## 2021-06-26 DIAGNOSIS — E039 Hypothyroidism, unspecified: Secondary | ICD-10-CM | POA: Insufficient documentation

## 2021-06-26 DIAGNOSIS — R4189 Other symptoms and signs involving cognitive functions and awareness: Secondary | ICD-10-CM

## 2021-06-26 DIAGNOSIS — N529 Male erectile dysfunction, unspecified: Secondary | ICD-10-CM | POA: Insufficient documentation

## 2021-06-26 DIAGNOSIS — E119 Type 2 diabetes mellitus without complications: Secondary | ICD-10-CM | POA: Insufficient documentation

## 2021-06-26 DIAGNOSIS — G20 Parkinson's disease: Secondary | ICD-10-CM

## 2021-06-26 DIAGNOSIS — M1711 Unilateral primary osteoarthritis, right knee: Secondary | ICD-10-CM | POA: Insufficient documentation

## 2021-06-26 NOTE — Progress Notes (Signed)
NEUROPSYCHOLOGICAL EVALUATION [place]. [facility] [place] Department of Neurology  Date of Evaluation: June 26, 2021  Reason for Referral:   Maxwell Maxwell Tate is a 65 y.o. right-handed Caucasian male referred by Maxwell Maxwell Tate, D.O., to characterize his current cognitive functioning and assist with diagnostic clarity and treatment planning in the context of Parkinson's disease and ongoing consideration for deep brain stimulation (DBS).  Assessment and Plan:   Clinical Impression(s): Overall, Maxwell Maxwell Tate pattern of performance is suggestive of neuropsychological functioning within normal limits relative to age-matched peers and premorbid intellectual estimations. Performance was appropriate across all assessed cognitive domains. This includes processing speed, attention/concentration, executive functioning, safety/judgment, receptive and expressive language, visuospatial abilities, and learning and memory. Maxwell Maxwell Tate denied difficulties completing instrumental activities of daily living (ADLs) independently.  Specific to memory, Maxwell Maxwell Tate was able to learn novel verbal and visual information efficiently and retain this knowledge after lengthy delays. Overall, memory performance combined with intact performances across other areas of cognitive functioning (e.g., confrontation naming, semantic fluency, visuoperceptual abilities) is not suggestive of an underlying neurodegenerative condition affecting memory processes (e.g., Alzheimer's disease). Likewise, his cognitive and behavioral profile is not suggestive of Lewy body dementia or frontotemporal dementia.  Important Considerations for DBS Candidacy:  1. Is the patient experiencing cognitive symptoms that far exceed what is expected for their situation? No. Cognitive functioning was appropriate across all assessed domains. 2. Is there a separate neurological process at work? No, there is no evidence for this based upon current  testing. 3. Were any psychological stressors identified within the individual and/or family beyond PD that may impact post-operative adjustment? No. 4. Maxwell Tate this person cope with the stress of surgery and be compliant as an awake participant in surgery? Yes, I believe so. However, it will be important to be wary of reported concerns surrounding claustrophobia.  5. Maxwell Tate this person participate in the multiple post-operative programming sessions and medication adjustments? Yes, I believe so. 6. From a neuropsychological perspective, does this person appear to be a good candidate for DBS? Yes, I do not have any cognitive-based concerns for Maxwell Maxwell Tate DBS eligibility.  Recommendations: Maxwell Maxwell Tate is encouraged to discuss next steps regarding his pre-surgical work-up with Maxwell Maxwell Tate. He expressed concern to me today regarding a brain MRI and symptoms of claustrophobia. It may be best if he is sedated for this procedure when it occurs.   He reported ongoing sleep dysfunction, likely worsened by REM sleep behaviors. Should he wish to attempt to improve these symptoms, he would be encouraged to discuss medication options with Maxwell Maxwell Tate. Medications to help him fall and remain asleep may also be worth trialing.   Maxwell Maxwell Tate is encouraged to attend to lifestyle factors for brain health (e.g., regular physical exercise, good nutrition habits, regular participation in cognitively-stimulating activities, and general stress management techniques), which are likely to have benefits for both emotional adjustment and cognition. Optimal control of vascular risk factors (including safe cardiovascular exercise and adherence to dietary recommendations) is encouraged. Continued participation in activities which provide mental stimulation and social interaction is also recommended.   If interested, there are some activities which have therapeutic value and Maxwell Tate be useful in keeping him cognitively stimulated. For suggestions, Mr.  Maxwell Tate is encouraged to go to the following website: https://www.[facility].org/get-to-know-barrow/centers-programs/neurorehabilitation-center/neuro-rehab-apps-and-games/ which has options, categorized by level of difficulty. It should be noted that these activities should not be viewed as a substitute for therapy.  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 He was diagnosed with Parkinson's disease in early 2019 and started on levodopa around that time. He followed up in April 2019 with external records indicating he had "no response to carbidopa/levodopa 25/100" so his dosage was subsequently increased.  He followed up again a month later with persisting tremor and his physician decided to increase his medication to carbidopa/levodopa 25/250, 1 tablet 3 times per day. Currently, he is prescribed carbidopa/levodopa 25/100 CR, 2 tablets at 7 AM/11 AM/4 PM, entacapone 200 mg, 1 tablet with each levodopa dose, and pramipexole ER, 1.5 mg daily. He reported no falls since his previous visit with Maxwell Maxwell Tate except for one instance where he rolled out of bed and onto his nightstand. He denied lightheadedness or near syncope. No report of concerning cognitive dysfunction was found in previous records. He expressed a desire to complete DBS as a means to manage upper extremity tremors. Ultimately he was referred for a comprehensive neuropsychological evaluation as part of his pre-surgical work-up.  No neuroimaging was available to review. However, he will be scheduled for a brain MRI as a part of his pre-surgical work-up.   Past Medical History:  Diagnosis Date   Arthritis of right knee    Cervical spondylosis with radiculopathy 06/26/2014   Mixed hyperlipidemia    Neck pain 05/22/2020   Parkinson's disease    Personal history of colonic polyps    Primary hypothyroidism    Spinal  stenosis, cervical region 05/22/2020   Type 2 diabetes mellitus without complication    Vitamin D deficiency     Past Surgical History:  Procedure Laterality Date   CARPAL TUNNEL RELEASE  2000   two weeks apart   HEMORRHOID SURGERY  2015   HEMORRHOID SURGERY  2005   KNEE SURGERY  2006   REPLACEMENT TOTAL KNEE  2021   RHINOPLASTY  1996   ROTATOR CUFF REPAIR  2006   ROTATOR CUFF REPAIR  2009   TESTICLE REMOVAL  2012    Current Outpatient Medications:    atorvastatin (LIPITOR) 40 MG tablet, Take 40 mg by mouth daily., Disp: , Rfl:    Carbidopa-Levodopa ER (SINEMET CR) 25-100 MG tablet controlled release, TAKE 2 TABS AT 7AM/ 11AM AND 4PM, Disp: 540 tablet, Rfl: 0   cholecalciferol (VITAMIN D3) 25 MCG (1000 UNIT) tablet, Take 1,000 Units by mouth in the morning and at bedtime., Disp: , Rfl:    diclofenac (VOLTAREN) 50 MG EC tablet, Take 50 mg by mouth 2 (two) times daily. Takes qd, Disp: , Rfl:    entacapone (COMTAN) 200 MG tablet, 1 TABLET AT 7AM/11AM/4PM WITH LEVODOPA DOSE, Disp: 270 tablet, Rfl: 0   levothyroxine (SYNTHROID) 50 MCG tablet, Take 50 mcg by mouth daily before breakfast., Disp: , Rfl:    Melatonin 10 MG TABS, Take 1 tablet by mouth at bedtime. Take additional 5mg  of melatonin, Disp: , Rfl:    melatonin 5 MG TABS, Take 5 mg by mouth at bedtime. Takes with 10mg  tablet qhs, Disp: , Rfl:    Multiple Vitamin (MULTIVITAMIN WITH MINERALS) TABS tablet, Take 1 tablet by mouth daily., Disp: , Rfl:    Multiple Vitamins-Minerals (OCUVITE PRESERVISION PO), Take 1 tablet by mouth in the morning and at bedtime., Disp: , Rfl:    MYRBETRIQ 25 MG TB24 tablet, Take 25 mg by mouth daily., Disp: , Rfl:    Pramipexole Dihydrochloride 1.5 MG TB24, TAKE 1 TABLET (1.5 MG TOTAL)  BY MOUTH DAILY., Disp: 30 tablet, Rfl: 3   sitaGLIPtin-metformin (JANUMET) 50-1000 MG tablet, Take 1 tablet by mouth at bedtime., Disp: , Rfl:   Clinical Interview:   The following information was obtained during a  clinical interview with Maxwell Maxwell Tate and his wife prior to cognitive testing.  Cognitive Symptoms: Decreased short-term memory: Largely denied. Maxwell Maxwell Tate did report a history of perhaps mis-remembering events or his recollection being different from others. However, his wife stated that this was longstanding, unchanged, and not cause for concern. He also acknowledged longstanding trouble recalling names, said to be stable over the past 4-5 years.  Decreased long-term memory: Denied. Decreased attention/concentration: Denied. Reduced processing speed: Denied. Difficulties with executive functions: Denied. Difficulties with emotion regulation: Denied. Difficulties with receptive language: Denied. Difficulties with word finding: Denied. Decreased visuoperceptual ability: Denied.  Trajectory of deficits: Maxwell Maxwell Tate generally denied cognitive concerns, noting that his functioning has seemed normal and unchanged over the years. His wife was in agreement with this.   Difficulties completing ADLs: Largely denied. His wife manages finances and bill paying which is longstanding in nature. He reported organizing his pillbox without difficulty. However, he did admit to some trouble with medication adherence from time to time. He reported driving without noted difficulty. However, he added that the stress of traffic Maxwell Tate make his tremor worse and he often defers to his wife when driving in higher population areas.   Additional Medical History: History of traumatic brain injury/concussion: Denied. History of stroke: Denied. History of seizure activity: Denied. History of known exposure to toxins: Denied. Symptoms of chronic pain: Endorsed. He primarily reported ongoing neck/nerve pain. He stated that these symptoms have somewhat improved lately with the help of regular exercise and massage treatments.  Experience of frequent headaches/migraines: Denied. He reported that he used to experience somewhat frequent  headaches but that these have notably diminished during the past eight months.  Frequent instances of dizziness/vertigo: Denied.  Sensory changes: About three weeks prior, he reported developing a floater in his left eye. He reported seeing his eye doctor several times and was assured of no retinal damage. He reported being able to see reasonably well with glasses. 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 Other sensory changes/difficulties (e.g., taste or smell) were denied.  Balance/coordination difficulties: Endorsed. He reported his balance "sometimes feeling a little off." He reported mild trouble with lower extremity weakness and an overall sense of instability. One side of the body was not said to be worse than the other. No recent falls were reported.  Other motor difficulties: Endorsed. He reported a significant tremor affecting his right hand which limits his functioning. Medications were said to be minimally helpful; however, he again acknowledged that he does not always take all three doses of levodopa medications each day.  Sleep History: Estimated hours obtained each night: 4 hours.  Difficulties falling asleep: Denied. Difficulties staying asleep: Endorsed. He reported waking up frequently during the night, both due to him needing to use the restroom, as well as other unknown reasons. He noted that upon awakening, he often has trouble falling back asleep. Some of this is due to having an overactive mind and worrying about various stressors or upcoming appointments.  Feels rested and refreshed upon awakening: Denied.  History of snoring: Denied. History of waking up gasping for air: Denied. Witnessed breath cessation while asleep: Denied.  History of vivid dreaming: Endorsed. Excessive movement while asleep: Endorsed. Instances of acting out his dreams:  Endorsed. His wife reported notable hitting/kicking behaviors, as well as at least one instance where he  has fallen out of bed while asleep. She further noted that he will yell out in his sleep and has seemed like he is fighting someone while asleep. Symptoms were said to begin around the time he was diagnosed with Parkinson's disease and have seemed stable.   Psychiatric/Behavioral Health History: Depression: He described his mood as "good" outside of perhaps some acute anxiety surrounding testing procedures. He denied to his knowledge any prior mental health concerns or diagnoses outside of perhaps some situational depression around his father's passing. Current or remote suicidal ideation, intent, or plan was denied.  Anxiety: Denied. Mania: Denied. Trauma History: Denied. Visual/auditory hallucinations: Denied. Delusional thoughts: Denied.  Tobacco: Denied. Alcohol: He denied current alcohol consumption as well as a history of problematic alcohol abuse or dependence.  Recreational drugs: Denied.  Family History: Problem Relation Age of Onset   Breast cancer Mother 65   Diabetic kidney disease Mother 1   Heart disease Father    Dementia Father        onset during mid to late 74s   Diabetes Sister    Lymphoma Brother    Parkinson's disease Maternal Grandfather    Crohn's disease Son    This information was confirmed by Maxwell Maxwell Tate.  Academic/Vocational History: Highest level of educational attainment: 12 years. He described himself as a somewhat poor (C/D) student in academic settings and often would skip school in order to work. English was noted as a likely relative weakness.  History of developmental delay: Denied. History of grade repetition: Denied. History of class failures: Denied. Enrollment in special education courses: Denied. History of LD/ADHD: Denied.  Employment: Retired. He previously worked for [employer].   Evaluation Results:   Behavioral Observations: Maxwell Maxwell Tate was accompanied by his wife, arrived to his appointment on time, and was  appropriately dressed and groomed. He appeared alert and oriented. Observed gait and station were slowed and he exhibited diminished arm swing. However, no frank balance instability was witnessed. A noticeable tremor involving his right hand was seen throughout the clinical interview. His affect was quiet and reserved at first. However, he did appear to become more relaxed as time progressed. Spontaneous speech was fluent and word finding difficulties were not observed during the clinical interview or testing procedures. Thought processes were coherent, organized, and normal in content. Insight into his cognitive difficulties appeared appropriate. During testing, sustained attention was appropriate. Task engagement was adequate and he persisted when challenged. Non-motor analogs were often utilized due to tremor symptoms affecting traditional motorized test components. Overall, Maxwell Maxwell Tate was cooperative with the clinical interview and subsequent testing procedures.   Adequacy of Effort: The validity of neuropsychological testing is limited by the extent to which the individual being tested may be assumed to have exerted adequate effort during testing. Maxwell Maxwell Tate expressed his intention to perform to the best of his abilities and exhibited adequate task engagement and persistence. Scores across stand-alone and embedded performance validity measures were within expectation. As such, the results of the current evaluation are believed to be a valid representation of Maxwell Maxwell Tate current cognitive functioning.  Test Results: Maxwell Maxwell Tate was fully oriented at the time of the current evaluation.  Intellectual abilities based upon educational and vocational attainment were estimated to be in the below average to average range. Premorbid abilities were estimated to be within the average range based upon a single-word reading test.  Processing speed was below average to above average. Basic attention was average.  More complex attention (e.g., working memory) was also average. Executive functioning was below average to above average. He also performed in the well above average range across a task assessing safety and judgment.  While not directly assessed, receptive language abilities were believed to be intact. Likewise, Mr. Ide did not exhibit any difficulties comprehending task instructions and answered all questions asked of him appropriately. Assessed expressive language (e.g., verbal fluency and confrontation naming) was average to above average.     Assessed visuospatial/visuoconstructional abilities were average to above average.    Learning (i.e., encoding) of novel verbal and visual information was mildly variable, ranging from the below average to exceptionally high normative ranges. Spontaneous delayed recall (i.e., retrieval) of previously learned information was average to well above average. Retention rates were 83% across a story learning task, 117% across a list learning task, and 100% across a shape learning task. Performance across recognition tasks was average to above average, suggesting evidence for information consolidation.   Results of emotional screening instruments suggested that recent symptoms of generalized anxiety were in the minimal range, while symptoms of depression were within normal limits. A screening instrument assessing recent sleep quality suggested the presence of moderate sleep dysfunction.  Tables of Scores:   Note: This summary of test scores accompanies the interpretive report and should not be considered in isolation without reference to the appropriate sections in the text. Descriptors are based on appropriate normative data and may be adjusted based on clinical judgment. Terms such as "Within Normal Limits" and "Outside Normal Limits" are used when a more specific description of the test score cannot be determined.       Percentile - Normative Descriptor > 98 -  Exceptionally High 91-97 - Well Above Average 75-90 - Above Average 25-74 - Average 9-24 - Below Average 2-8 - Well Below Average < 2 - Exceptionally Low       Validity:   DESCRIPTOR       ACS Word Choice: --- --- Within Normal Limits  Dot Counting Test: --- --- Within Normal Limits  NAB EVI: --- --- Within Normal Limits  D-KEFS Color Word Effort Index: --- --- Within Normal Limits       Orientation:      Raw Score Percentile   NAB Orientation, Form 1 29/29 --- ---       Cognitive Screening:      Raw Score Percentile   SLUMS: 27/30 --- ---       Intellectual Functioning:      Standard Score Percentile   Test of Premorbid Functioning: 93 32 Average       Memory:     NAB Memory Module, Form 1: Standard Score/ T Score Percentile   Total Memory Index 107 68 Average  List Learning       Total Trials 1-3 16/36 (42) 21 Below Average    List B 4/12 (53) 62 Average    Short Delay Free Recall 6/12 (50) 50 Average    Long Delay Free Recall 7/12 (55) 69 Average    Retention Percentage 117 (57) 75 Above Average    Recognition Discriminability 5 (43) 25 Average  Shape Learning       Total Trials 1-3 22/27 (70) 98 Exceptionally High    Delayed Recall 8/9 (69) 97 Well Above Average    Retention Percentage 100 (51) 54 Average    Recognition Discriminability 8 (57) 75 Above Average  Story Learning       Immediate Recall 54/80 (48) 42 Average    Delayed Recall 25/40 (44) 27 Average    Retention Percentage 83 (47) 38 Average  Daily Living Memory       Immediate Recall 42/51 (55) 69 Average    Delayed Recall 13/17 (50) 50 Average    Retention Percentage 76 (43) 25 Average    Recognition Hits 10/10 (60) 84 Above Average       Attention/Executive Function:     Oral Trail Making Test (OTMT): Raw Score (Z-Score) Percentile     Part A 9 secs.,  0 errors (-0.77) 22 Below Average    Part B 56 secs.,  1 error (-0.9) 18 Below Average       Symbol Digit Modalities Test (SDMT): Raw  Score (T Score) Percentile     Oral 49 (56) 73 Average       NAB Attention Module, Form 1: T Score Percentile     Digits Forward 49 46 Average    Digits Backwards 46 34 Average        Scaled Score Percentile   WAIS-IV Similarities: 9 37 Average       D-KEFS Color-Word Interference Test: Raw Score (Scaled Score) Percentile     Color Naming 26 secs. (13) 84 Above Average    Word Reading 22 secs. (11) 63 Average    Inhibition 54 secs. (13) 84 Above Average      Total Errors 0 errors (12) 75 Above Average    Inhibition/Switching 63 secs. (12) 75 Above Average      Total Errors 1 error (12) 75 Above Average       NAB Executive Functions Module, Form 1: T Score Percentile     Judgment 63 91 Well Above Average       Language:     Verbal Fluency Test: Raw Score (T Score) Percentile     Phonemic Fluency (FAS) 41 (51) 54 Average    Animal Fluency 23 (62) 88 Above Average        NAB Language Module, Form 1: T Score Percentile     Naming 31/31 (57) 75 Above Average       Visuospatial/Visuoconstruction:      Raw Score Percentile   Clock Drawing: 8/10 --- Within Normal Limits       NAB Spatial Module, Form 1: T Score Percentile     Visual Discrimination 62 88 Above Average    Figure Drawing Copy 54 66 Average        Scaled Score Percentile   WAIS-IV Visual Puzzles: 11 63 Average       Mood and Personality:      Raw Score Percentile   Beck Depression Inventory - II: 1 --- Within Normal Limits  PROMIS Anxiety Questionnaire: 7 --- None to Slight       Additional Questionnaires:      Raw Score Percentile   PROMIS Sleep Disturbance Questionnaire: 33 --- Moderate   Informed Consent and Coding/Compliance:   Maxwell Maxwell Tate was provided with a verbal description of the nature and purpose of the present neuropsychological evaluation. Also reviewed were the foreseeable risks and/or discomforts and benefits of the procedure, limits of confidentiality, and mandatory reporting requirements of  this provider. The patient was given the opportunity to ask questions and receive answers about the evaluation. Oral consent to participate was provided by the patient.   This evaluation was conducted by Newman Nickels, Ph.D., licensed clinical neuropsychologist. Mr.  Oretha Maxwell Tate completed a comprehensive clinical interview with Dr. Milbert Coulter, billed as one unit [phone], and 125 minutes of cognitive testing and scoring, billed as one unit [phone] and three additional units [id number]. Psychometrist Wallace Keller, B.S., assisted Dr. Milbert Coulter with test administration and scoring procedures. As a separate and discrete service, Dr. Milbert Coulter spent a total of 100 minutes in interpretation and report writing billed as one unit [phone] and one unit [phone].

## 2021-06-26 NOTE — Progress Notes (Signed)
   Psychometrician Note   Cognitive testing was administered to Maxwell Tate by Maxwell Tate, B.S. (psychometrist) under the supervision of Dr. Newman Nickels, Ph.D., licensed psychologist on 06/26/21. Maxwell Tate did not appear overtly distressed by the testing session per behavioral observation or responses across self-report questionnaires. Rest breaks were offered.    The battery of tests administered was selected by Dr. Newman Nickels, Ph.D. with consideration to Maxwell Tate current level of functioning, the nature of his symptoms, emotional and behavioral responses during interview, level of literacy, observed level of motivation/effort, and the nature of the referral question. This battery was communicated to the psychometrist. Communication between Dr. Newman Nickels, Ph.D. and the psychometrist was ongoing throughout the evaluation and Dr. Newman Nickels, Ph.D. was immediately accessible at all times. Dr. Newman Nickels, Ph.D. provided supervision to the psychometrist on the date of this service to the extent necessary to assure the quality of all services provided.    Maxwell Tate will return within approximately 1-2 weeks for an interactive feedback session with Maxwell Tate at which time his test performances, clinical impressions, and treatment recommendations will be reviewed in detail. Maxwell Tate understands he can contact our office should he require our assistance before this time.  A total of 125 minutes of billable time were spent face-to-face with Maxwell Tate by the psychometrist. This includes both test administration and scoring time. Billing for these services is reflected in the clinical report generated by Dr. Newman Nickels, Ph.D.  This note reflects time spent with the psychometrician and does not include test scores or any clinical interpretations made by Maxwell Tate. The full report will follow in a separate note.

## 2021-07-07 ENCOUNTER — Other Ambulatory Visit: Payer: Self-pay

## 2021-07-07 ENCOUNTER — Ambulatory Visit (INDEPENDENT_AMBULATORY_CARE_PROVIDER_SITE_OTHER): Payer: Medicare Other | Admitting: Psychology

## 2021-07-07 DIAGNOSIS — G20 Parkinson's disease: Secondary | ICD-10-CM

## 2021-07-07 NOTE — Progress Notes (Signed)
   Neuropsychology Feedback Session Maxwell Tate. [facility]  Department of Neurology  Reason for Referral:   Maxwell Tate is a 65 y.o. right-handed Caucasian male referred by Kerin Salen, D.O., to characterize his current cognitive functioning and assist with diagnostic clarity and treatment planning in the context of Parkinson's disease and ongoing consideration for deep brain stimulation (DBS).  Feedback:   [name] completed a comprehensive neuropsychological evaluation on 06/26/2021. Please refer to that encounter for the full report and recommendations. Briefly, results suggested neuropsychological functioning within normal limits relative to age-matched peers and premorbid intellectual estimations. Performance was appropriate across all assessed cognitive domains. I do not have any cognitive-based concerns for [name] DBS eligibility.  [name] was accompanied by his wife during the current feedback session. Content of the current session focused on the results of his neuropsychological evaluation. [name] was given the opportunity to ask questions and his questions were answered. He was encouraged to reach out should additional questions arise. A copy of his report was provided at the conclusion of the visit.      Less than 16 minutes were spent conducting the current feedback session with [name], billed as one unit [phone].

## 2021-07-08 ENCOUNTER — Other Ambulatory Visit: Payer: Self-pay

## 2021-07-08 ENCOUNTER — Telehealth: Payer: Self-pay | Admitting: Neurology

## 2021-07-08 NOTE — Telephone Encounter (Signed)
Sent patient a my chart message with details of his on and off test

## 2021-07-08 NOTE — Progress Notes (Signed)
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  Needs to take medication faithfully  -Continue entacapone 200 mg, 1 tablet with each levodopa dose  -Continue pramipexole ER, 1.5 mg daily  -proud of him for exercising.    -Levodopa challenge test done today, July 10, 2021, demonstrated some benefit to tremor, but he does have some evidence of levodopa resistant tremor.    -Neurocognitive testing done June 26, 2021 was unremarkable.  -Patient very interested in DBS surgery because of levodopa resistant tremor.  He understands that oftentimes we do not do DBS surgery until we see symptoms on both sides, so that the surgery can be done on both sides at the same time.  However, the patient reports that it is interfering with his ability to hunt, which is important in his lifestyle.  He very much wants to pursue unilateral STN DBS on the left.  -We discussed extensively risks, benefits, and side effects of DBS surgery.  Discussed included but was not limited to risks of intraoperative bleeding, paralysis, death, seizure, infection.  Patient asked many questions and answered those to the best of my ability.  Patient is a diabetic and I discussed with him the importance of strict glycemic control.             -Preop video P             -will order pre-op MRI today.  Patient is claustrophobic and likely will need at least Valium for proper images.             -Pt will be referred to dr Venetia Maxon             -Postoperative appointments are given.           2.  RBD  -Safety discussed.  He still doesn't want medication for that. Subjective:   Maxwell Tate was seen today in follow up for levodopa challenge test for Parkinson's.  Patient held Parkinson's medicines for today's visit.  Neurocognitive testing done on August 19 by Dr. Milbert Coulter is reviewed.  This was normal.  He has been off his  Parkinson's medicines since Wednesday, and states that he feels more stiff and was surprised that he had more tremor, as he thought that the medications did not help tremor at all.  Current prescribed movement disorder medications: Carbidopa/levodopa 25/100 CR, 2 tablets at 7 AM/11 AM/4 PM (pt admits that has trouble taking tid and we have already reduced from qid - often getting bid) Pramipexole ER, 1.5 mg daily  Entacapone, 200 mg, 1 tablet 3 times per day  PREVIOUS MEDICATIONS: Carbidopa/levodopa 25/250  ALLERGIES:  No Known Allergies  CURRENT MEDICATIONS:  Outpatient Encounter Medications as of 07/10/2021  Medication Sig   atorvastatin (LIPITOR) 40 MG tablet Take 40 mg by mouth daily.   Carbidopa-Levodopa ER (SINEMET CR) 25-100 MG tablet controlled release TAKE 2 TABS AT 7AM/ 11AM AND 4PM   cholecalciferol (VITAMIN D3) 25 MCG (1000 UNIT) tablet Take 1,000 Units by mouth in the morning and at bedtime.   diclofenac (VOLTAREN) 50 MG EC tablet Take 50 mg by mouth 2 (two) times daily. Takes qd   entacapone (COMTAN) 200 MG tablet 1 TABLET AT 7AM/11AM/4PM WITH LEVODOPA DOSE   levothyroxine (SYNTHROID) 50 MCG tablet Take 50 mcg by mouth daily before breakfast.   Melatonin 10 MG TABS Take 1 tablet by mouth at  bedtime. Take additional 5mg  of melatonin   melatonin 5 MG TABS Take 5 mg by mouth at bedtime. Takes with 10mg  tablet qhs   Multiple Vitamin (MULTIVITAMIN WITH MINERALS) TABS tablet Take 1 tablet by mouth daily.   Multiple Vitamins-Minerals (OCUVITE PRESERVISION PO) Take 1 tablet by mouth in the morning and at bedtime.   MYRBETRIQ 25 MG TB24 tablet Take 25 mg by mouth daily.   Pramipexole Dihydrochloride 1.5 MG TB24 TAKE 1 TABLET (1.5 MG TOTAL) BY MOUTH DAILY.   sitaGLIPtin-metformin (JANUMET) 50-1000 MG tablet Take 1 tablet by mouth at bedtime.   No facility-administered encounter medications on file as of 07/10/2021.    Objective:   PHYSICAL EXAMINATION:    VITALS:   Vitals:    07/10/21 0815  BP: (!) 145/62  Pulse: 82  SpO2: 97%  Weight: 221 lb 12.8 oz (100.6 kg)  Height: 5\' 9"  (1.753 m)     Levodopa challenge done today.  UPDRS motor off score was 14.  Pt then given 300mg  of levodopa dissolved in ginger ale and waited 50 minutes to re-examine him.  UPDRS motor on score was 7.  Details of UPDRS motor score documented on separate neurophysiologic worksheet.       Total time spent on today's visit was 81 minutes, including both face-to-face time and nonface-to-face time.  Time included that spent on review of records (prior notes available to me/labs/imaging if pertinent), discussing treatment and goals, answering patient's questions and coordinating care.  Cc:  09/09/2021, FNP

## 2021-07-08 NOTE — Telephone Encounter (Signed)
-----   Message from Octaviano Batty Kenise Barraco, DO sent at 06/08/2021  7:59 AM EDT ----- Remind patient to hold pramipexole 24 hours prior to on/off test on Friday and to hold carbidopa/levodopa 25/100 and entacapone after noon on tomorrow

## 2021-07-08 NOTE — Telephone Encounter (Signed)
Called patient around 11:30 and LVM

## 2021-07-09 NOTE — Telephone Encounter (Signed)
Called patient today to explain process for on / off test patient has not answered and I have LVM and My chart message for the patient

## 2021-07-10 ENCOUNTER — Other Ambulatory Visit: Payer: Self-pay

## 2021-07-10 ENCOUNTER — Encounter: Payer: Self-pay | Admitting: Neurology

## 2021-07-10 ENCOUNTER — Ambulatory Visit: Payer: Medicare Other | Admitting: Neurology

## 2021-07-10 VITALS — BP 145/62 | HR 82 | Ht 69.0 in | Wt 221.8 lb

## 2021-07-10 DIAGNOSIS — G20 Parkinson's disease: Secondary | ICD-10-CM | POA: Diagnosis not present

## 2021-07-10 MED ORDER — CARBIDOPA-LEVODOPA ER 25-100 MG PO TBCR
3.0000 | EXTENDED_RELEASE_TABLET | Freq: Once | ORAL | Status: AC
  Administered 2021-07-10: 3 via ORAL

## 2021-07-14 ENCOUNTER — Other Ambulatory Visit: Payer: Self-pay

## 2021-07-14 DIAGNOSIS — G20 Parkinson's disease: Secondary | ICD-10-CM

## 2021-07-14 NOTE — Addendum Note (Signed)
Addended by: Karl Luke A on: 07/14/2021 02:05 PM   Modules accepted: Orders

## 2021-07-28 ENCOUNTER — Other Ambulatory Visit (HOSPITAL_COMMUNITY): Payer: Self-pay | Admitting: Neurosurgery

## 2021-07-28 ENCOUNTER — Other Ambulatory Visit: Payer: Self-pay | Admitting: Neurosurgery

## 2021-07-28 DIAGNOSIS — G20 Parkinson's disease: Secondary | ICD-10-CM

## 2021-08-01 ENCOUNTER — Other Ambulatory Visit: Payer: Self-pay | Admitting: Neurology

## 2021-08-01 DIAGNOSIS — G20 Parkinson's disease: Secondary | ICD-10-CM

## 2021-08-03 ENCOUNTER — Other Ambulatory Visit: Payer: Self-pay

## 2021-10-08 ENCOUNTER — Encounter (HOSPITAL_COMMUNITY): Payer: Self-pay

## 2021-10-08 ENCOUNTER — Ambulatory Visit (HOSPITAL_COMMUNITY): Payer: Medicare Other

## 2021-10-08 ENCOUNTER — Telehealth: Payer: Self-pay | Admitting: Neurology

## 2021-10-08 MED ORDER — DIAZEPAM 5 MG PO TABS: ORAL_TABLET | ORAL | 0 refills | Status: DC

## 2021-10-08 NOTE — Telephone Encounter (Signed)
This patient is scheduled for dbs in Jan/Feb.  Just happened to notice that his pre-op MRI was never done.  I ordered that 3 months ago.  Can you call hospital and find out whats going on and reason for delay?    Also, I wrote in my last note that we gave him post op appts but don't see any scheduled.  Annabelle Harman, he needs 2 post op dbs appts and 1 pre-op appt.  See me with questions about those.

## 2021-10-08 NOTE — Telephone Encounter (Signed)
I will personally call insurance for you and see what is going on.thank you

## 2021-10-08 NOTE — Telephone Encounter (Signed)
No prior authorization required reference number [id number], calling [place] for scheduling at this time for Mri to be done quickly. Cpt [id number]

## 2021-10-08 NOTE — Telephone Encounter (Signed)
Pt is sch for the pre op on 11-11-21 should have the MRI done before this appt and the post op appts are also sch  01-04-22 and 01-18-21

## 2021-10-08 NOTE — Telephone Encounter (Addendum)
Patient is scheduled for MRI brain w wo contrast for Pre-op DBS for 10/16/2021 at 4:00 at [facility]. Patient notified and verbally understood appt time.    Called patient back at 3:42 patient does have claustrophobia. Does request Rx for this sent to pharmacy. Pharmacy is correct. Thanks

## 2021-10-16 ENCOUNTER — Other Ambulatory Visit: Payer: Self-pay

## 2021-10-16 ENCOUNTER — Ambulatory Visit (HOSPITAL_COMMUNITY)
Admission: RE | Admit: 2021-10-16 | Discharge: 2021-10-16 | Disposition: A | Discharge status: Home / Self Care | Payer: Medicare Other | Source: Ambulatory Visit | Attending: Neurology | Admitting: Neurology

## 2021-10-16 DIAGNOSIS — G20 Parkinson's disease: Secondary | ICD-10-CM | POA: Diagnosis present

## 2021-10-16 IMAGING — MR MR HEAD WO/W CM
6 of 13 series · 24 of 48 positions shown · IV contrast (gadavist)
Comparison: MRI of the cervical spine [DATE].

CLINICAL DATA: Provided history: Parkinson's disease. Preoperative
ASIATIC.

EXAM:
MRI HEAD WITHOUT AND WITH CONTRAST
TECHNIQUE: Multiplanar, multiecho pulse sequences of the brain and surrounding
structures were obtained without and with intravenous contrast.
CONTRAST:  10mL GADAVIST GADOBUTROL 1 MMOL/ML IV SOLN

[Series 2: DWI · axial · 3.0mm · 0.94mm/px · z∈[-70,+91]mm · 7 of 108 slices shown (1 of 2)]
[im 1/108]
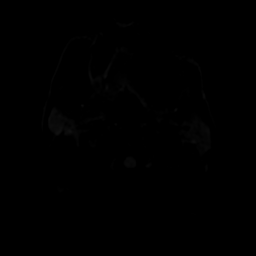
[im 18/108]
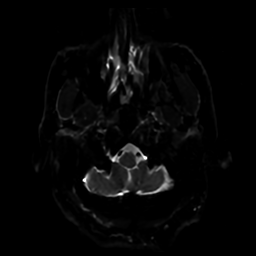
[im 36/108]
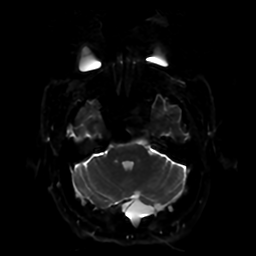
[im 54/108]
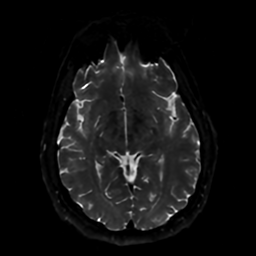
[im 72/108]
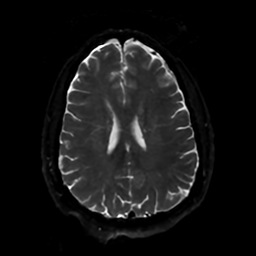
[im 90/108]
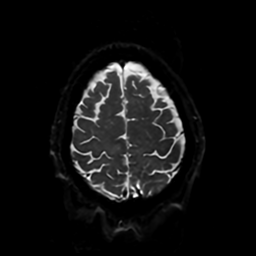
[im 108/108]
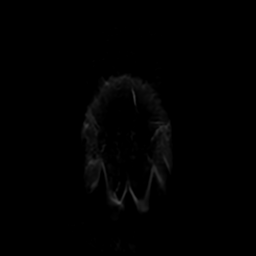

[Series 3: DWI · coronal · 4.0mm · 0.94mm/px · 5 of 76 slices shown (2 of 2)]
[im 1/76]
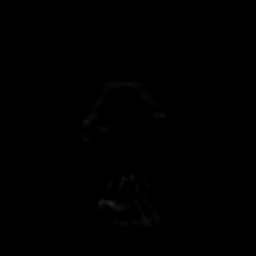
[im 19/76]
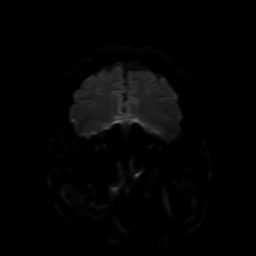
[im 38/76]
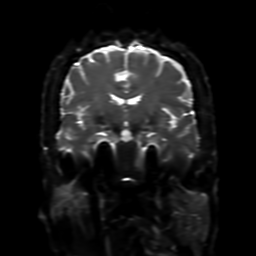
[im 57/76]
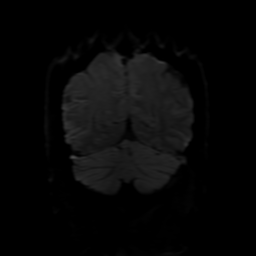
[im 76/76]
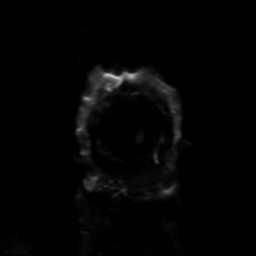

[Series 4: FLAIR · sagittal · 5.0mm · 0.23mm/px · 2 of 26 slices shown (1 of 2)]
[im 1/26]
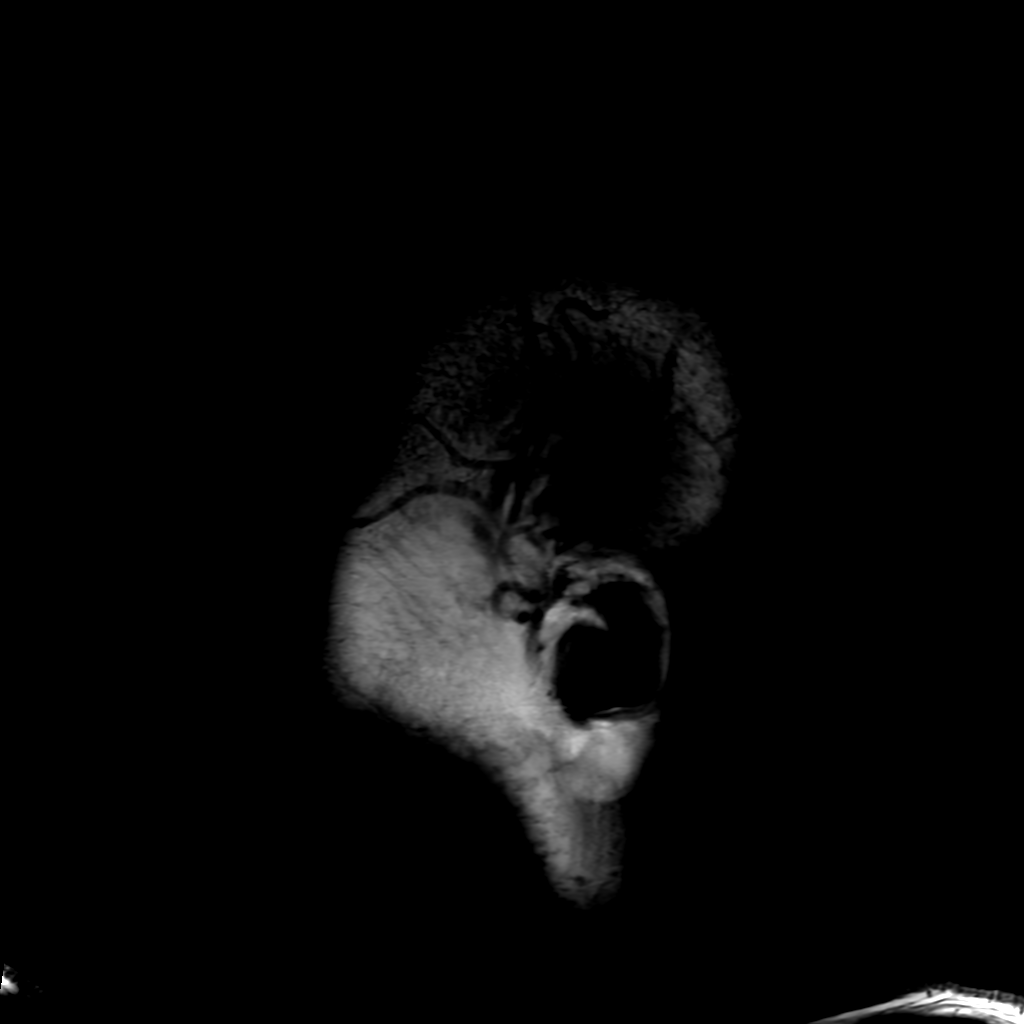
[im 26/26]
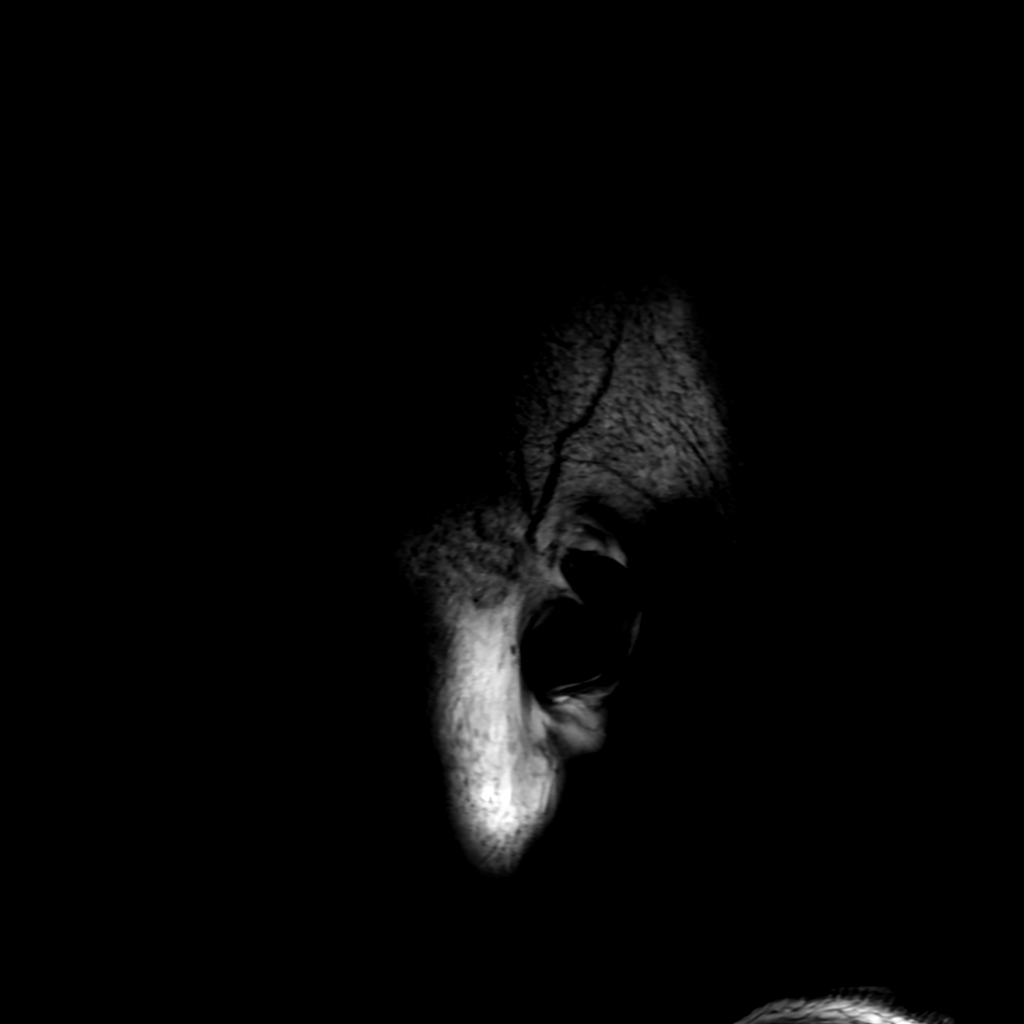

[Series 6: FLAIR · axial · 4.0mm · 0.47mm/px · z∈[-64,+93]mm · 3 of 37 slices shown (2 of 2)]
[im 1/37]
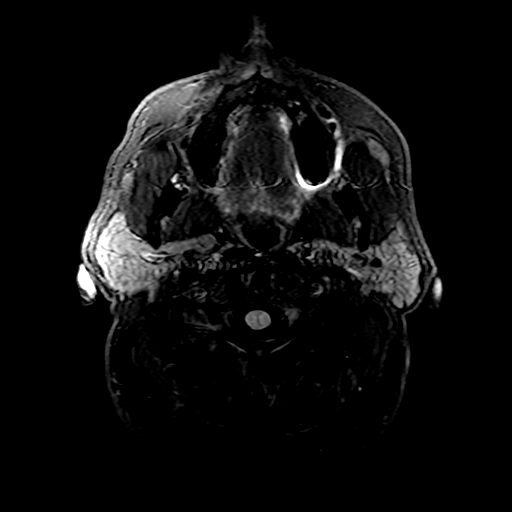
[im 19/37]
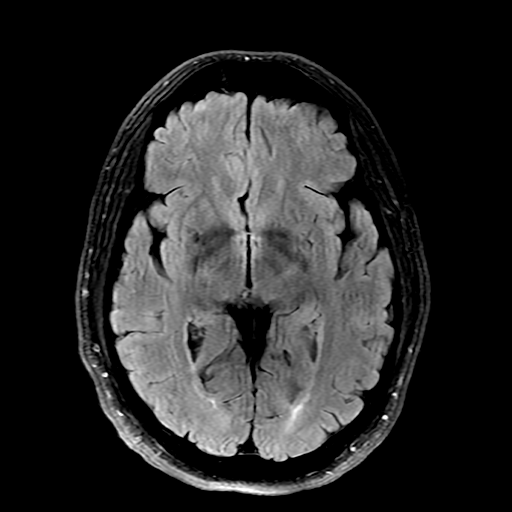
[im 37/37]
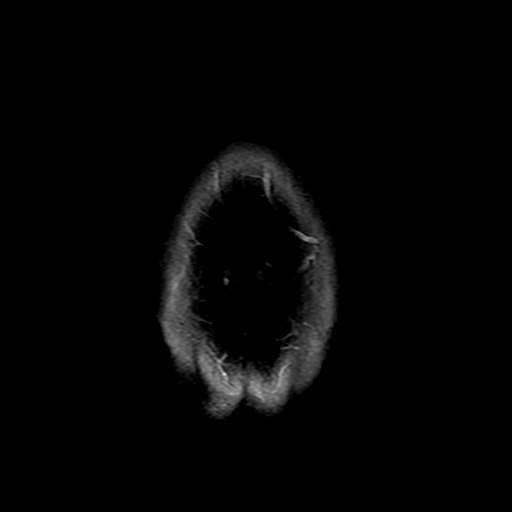

[Series 250: ADC · axial · 3.0mm · 0.94mm/px · z∈[-70,+91]mm · 4 of 55 slices shown (1 of 2)]
[im 1/55]
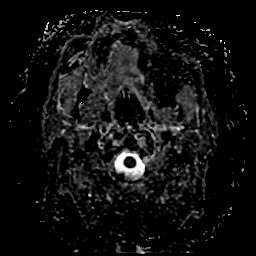
[im 19/55]
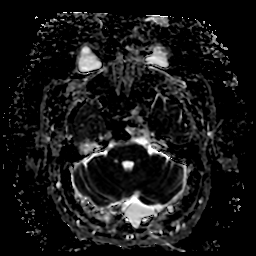
[im 37/55]
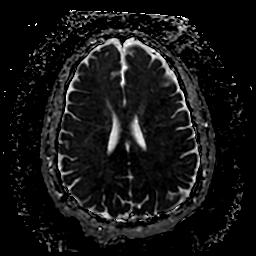
[im 55/55]
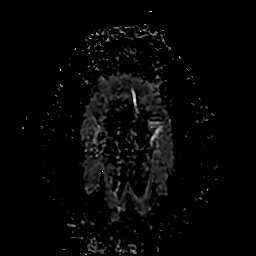

[Series 350: ADC · coronal · 4.0mm · 0.94mm/px · 3 of 38 slices shown (2 of 2)]
[im 1/38]
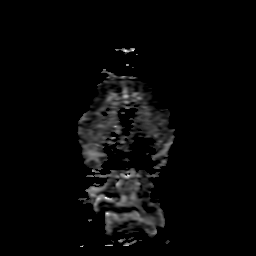
[im 19/38]
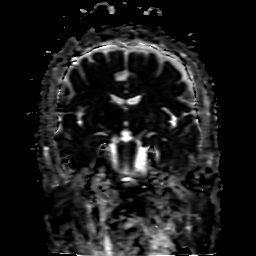
[im 38/38]
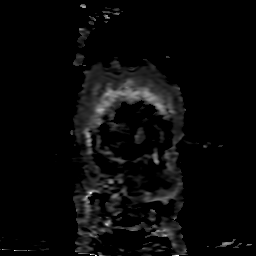

[24 of 48 positions shown; findings below may reference images not displayed]

FINDINGS: Brain:

Mild-to-moderate intermittent motion degradation.

Cerebral volume is normal.

Prominent perivascular space within the right caudate nucleus
(series 10, image 18).

Asymmetric CSF intensity focus posterior to the left cerebellar
hemisphere, measuring 2.8 x 2.0 x 3.1 cm, compatible with an
arachnoid cyst (for instance as seen on series 7, image 10) (series
4, image 14).

No cortical encephalomalacia is identified. No significant cerebral
white matter disease.

There is no acute infarct.

No evidence of an intracranial mass.

No chronic intracranial blood products.

No midline shift.

No pathologic intracranial enhancement identified.

Vascular: Maintained flow voids within the proximal large arterial
vessels.

Skull and upper cervical spine: No focal suspicious marrow lesion.
Incompletely assessed cervical spondylosis.

Sinuses/Orbits: Visualized orbits show no acute finding. Bilateral
lens replacements. Mild mucosal thickening within the bilateral
maxillary sinuses.

Other: Fluid within the bilateral mastoid air cells (trace right,
small-volume on the left).
IMPRESSION: 1. Mild-to-moderately motion degraded exam.
[DATE] cm left retro-cerebellar arachnoid cyst.
3. Otherwise unremarkable MRI appearance of the brain for age. No
evidence of acute intracranial abnormality.
4. Mild mucosal thickening within the bilateral maxillary sinuses.
5. Small-volume fluid within the bilateral mastoid air cells.

## 2021-10-16 MED ORDER — GADOBUTROL 1 MMOL/ML IV SOLN
10.0000 mL | Freq: Once | INTRAVENOUS | Status: AC | PRN
  Administered 2021-10-16: 10 mL via INTRAVENOUS

## 2021-10-19 ENCOUNTER — Telehealth: Payer: Self-pay

## 2021-10-19 ENCOUNTER — Other Ambulatory Visit: Payer: Self-pay

## 2021-10-19 ENCOUNTER — Telehealth: Payer: Self-pay | Admitting: Neurology

## 2021-10-19 ENCOUNTER — Other Ambulatory Visit: Payer: Self-pay | Admitting: Neurology

## 2021-10-19 DIAGNOSIS — G20 Parkinson's disease: Secondary | ICD-10-CM

## 2021-10-19 NOTE — Progress Notes (Signed)
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  Needs to take medication faithfully  -Continue entacapone 200 mg, 1 tablet with each levodopa dose  -Continue pramipexole ER, 1.5 mg daily  -Patient given dosing schedule to wean off medicine preop.  -Levodopa challenge test done July 10, 2021, demonstrated some benefit to tremor, but he does have some evidence of levodopa resistant tremor.    -Neurocognitive testing done June 26, 2021 was unremarkable.  -Patient wants to proceed with unilateral L STN DBS.  He wants left IPG placement.  Talked with Dr. Reatha Armour today to change the schedule from bilateral STN to left STN.  -We discussed extensively risks, benefits, and side effects of DBS surgery.  Discussed included but was not limited to risks of intraoperative bleeding, paralysis, death, seizure, infection.  Patient asked many questions and answered those to the best of my ability.  Patient is a diabetic and I discussed with him the importance of strict glycemic control.  We discussed exactly what that meant.  We discussed dietary restrictions, especially over the holidays.             -Preop video done today  -Reorder MRI brain with and without gadolinium with DBS protocol under general anesthesia.  Attempted with Valium and was unsuccessful.  -Postoperative appointments given the patient.  -Preop H&P completed.          2.  RBD  -Safety discussed.  He still doesn't want medication for that. Subjective:   Maxwell Tate was seen today in follow up.  Patient had MRI done for preop DBS.  Images were not adequate for DBS planning.  He comes today so that we can get a pre-op H&P for an MRI under sedation so that there is no movement artifact and proper protocol is used.  Wife accompanies patient today.  They have met with Dr. Reatha Armour.  Current prescribed movement disorder  medications: Carbidopa/levodopa 25/100 CR, 2 tablets at 7 AM/11 AM/4 PM (pt admits that has trouble taking tid and we have already reduced from qid - often getting bid) Pramipexole ER, 1.5 mg daily  Entacapone, 200 mg, 1 tablet 3 times per day  PREVIOUS MEDICATIONS: Carbidopa/levodopa 25/250  ALLERGIES:  No Known Allergies  CURRENT MEDICATIONS:  Outpatient Encounter Medications as of 10/21/2021  Medication Sig   atorvastatin (LIPITOR) 40 MG tablet Take 40 mg by mouth daily.   Carbidopa-Levodopa ER (SINEMET CR) 25-100 MG tablet controlled release TAKE 2 TABS AT 7AM/ 11AM AND 4PM   cholecalciferol (VITAMIN D3) 25 MCG (1000 UNIT) tablet Take 1,000 Units by mouth in the morning and at bedtime.   diazepam (VALIUM) 5 MG tablet 1 tab 1 hour prior to procedure; may repeat 15-30 min prior to procedure   diclofenac (VOLTAREN) 50 MG EC tablet Take 50 mg by mouth 2 (two) times daily. Takes qd   entacapone (COMTAN) 200 MG tablet 1 TABLET AT 7AM/11AM/4PM WITH LEVODOPA DOSE   levothyroxine (SYNTHROID) 50 MCG tablet Take 50 mcg by mouth daily before breakfast.   Melatonin 10 MG TABS Take 1 tablet by mouth at bedtime. Take additional 45m of melatonin   melatonin 5 MG TABS Take 5 mg by mouth at bedtime. Takes with 131mtablet qhs   Multiple Vitamin (MULTIVITAMIN WITH MINERALS) TABS tablet Take 1 tablet by mouth daily.   Multiple Vitamins-Minerals (OCUVITE PRESERVISION PO) Take 1 tablet by mouth in  the morning and at bedtime.   MYRBETRIQ 25 MG TB24 tablet Take 25 mg by mouth daily.   Pramipexole Dihydrochloride 1.5 MG TB24 TAKE 1 TABLET BY MOUTH ONCE DAILY   sitaGLIPtin-metformin (JANUMET) 50-1000 MG tablet Take 1 tablet by mouth at bedtime.   No facility-administered encounter medications on file as of 10/21/2021.    Objective:   PHYSICAL EXAMINATION:    VITALS:   Vitals:   10/21/21 0837  BP: 126/84  Pulse: 62  SpO2: 98%  Weight: 220 lb 3.2 oz (99.9 kg)  Height: 5' 9"  (1.753 m)    GEN:  The  patient appears stated age and is in NAD. HEENT:  Normocephalic, atraumatic.  The mucous membranes are moist. The superficial temporal arteries are without ropiness or tenderness. CV:  RRR Lungs:  CTAB Neck/HEME:  There are no carotid bruits bilaterally.   Neurological examination:   Orientation: The patient is alert and oriented x3. Cranial nerves: There is good facial symmetry with mild facial hypomimia. The speech is fluent and clear. Soft palate rises symmetrically and there is no tongue deviation. Hearing is intact to conversational tone. Sensation: Sensation is intact to light touch throughout Motor: Strength is at least antigravity x4.  Tone: There is mild increased tone in the right upper extremity.  Left is normal. Abnormal movements: there is near constant mod amplitude right upper extremity rest tremor.  None on the left. Coordination:  There is just minimal decremation on the right. Gait and Station: The patient easily arises out of the chair.  Negative pull test.   Total time spent on today's visit was 74 minutes, including both face-to-face time and nonface-to-face time.  Time included that spent on review of records (prior notes available to me/labs/imaging if pertinent), discussing treatment and goals, answering patient's questions and coordinating care.  Cc:  Tresa Endo, FNP

## 2021-10-19 NOTE — Telephone Encounter (Signed)
Called patient and explained that he needs to withhold the Pramipexole for Tuesday the 13 th and to stop taking his carbidopa Levodopa after his 11:00 AM  dose . Patient is aware of the MRI and is awaiting the call to reschedule.

## 2021-10-19 NOTE — Telephone Encounter (Signed)
MRI images not okay for dbs planning.  Dbs protocol not followed and too much motion artifact.  May need to be done under general anesthesia.  I emailed [facility] about it.  Please follow this up asap.

## 2021-10-19 NOTE — Telephone Encounter (Signed)
Sent email to [facility] and [facility]scher that we are seeing patient this Wednesday if they can give patient the MRI slot they were holding on December 22nd. I also have put orders in for a MRI Brain for DBS. I asked in my email  I need to contact the patient. I have already called patient and let them know we would have to redo the MRI and that the patient needed to hold medication

## 2021-10-21 ENCOUNTER — Other Ambulatory Visit: Payer: Self-pay

## 2021-10-21 ENCOUNTER — Encounter: Payer: Self-pay | Admitting: Neurology

## 2021-10-21 ENCOUNTER — Ambulatory Visit: Payer: Medicare Other | Admitting: Neurology

## 2021-10-21 VITALS — BP 126/84 | HR 62 | Ht 69.0 in | Wt 220.2 lb

## 2021-10-21 DIAGNOSIS — G20 Parkinson's disease: Secondary | ICD-10-CM

## 2021-10-21 NOTE — Patient Instructions (Addendum)
On 12/03/21, decrease pramipexole ER to 1/2 tablet daily On 12/08/21, STOP .pramipexole ER NO carbidopa/levodopa 25/100 or entacapone after 1/31 dosages.

## 2021-10-26 NOTE — Telephone Encounter (Signed)
I am reaching out to radiology to verify if patient needs to take medication

## 2021-10-27 ENCOUNTER — Encounter (HOSPITAL_COMMUNITY): Payer: Self-pay | Admitting: *Deleted

## 2021-10-27 ENCOUNTER — Other Ambulatory Visit: Payer: Self-pay

## 2021-10-27 NOTE — Progress Notes (Signed)
Spoke with pt for pre-op call. Pt denies cardiac history or HTN. Pt is a type 2 diabetic. Pt states his last A1C was 7.2 about a month ago. He states his fasting blood sugar is usually between 112-130. Instructed pt to check his blood sugar when he wakes up Thursday AM and every 2 hours until he leaves for the hospital. If blood sugar is 70 or below, treat with 1/2 cup of clear juice (apple or cranberry) and recheck blood sugar 15 minutes after drinking juice. If blood sugar continues to be 70 or below, call the Short Stay department and ask to speak to a nurse. Pt voiced understanding. Instructed pt not to take his Janumet the morning of surgery.   Pt's surgery is scheduled as ambulatory so no Covid test is required prior to surgery.

## 2021-10-29 ENCOUNTER — Other Ambulatory Visit: Payer: Self-pay

## 2021-10-29 ENCOUNTER — Encounter (HOSPITAL_COMMUNITY): Admission: AD | Disposition: A | Discharge status: Home / Self Care | Payer: Self-pay | Source: Ambulatory Visit

## 2021-10-29 ENCOUNTER — Ambulatory Visit (HOSPITAL_COMMUNITY): Payer: Medicare Other | Admitting: Anesthesiology

## 2021-10-29 ENCOUNTER — Ambulatory Visit (HOSPITAL_COMMUNITY)
Admission: AD | Admit: 2021-10-29 | Discharge: 2021-10-29 | Disposition: A | Discharge status: Home / Self Care | Payer: Medicare Other | Source: Ambulatory Visit | Attending: Neurology | Admitting: Neurology

## 2021-10-29 ENCOUNTER — Ambulatory Visit (HOSPITAL_COMMUNITY)
Admission: RE | Admit: 2021-10-29 | Discharge: 2021-10-29 | Disposition: A | Discharge status: Home / Self Care | Payer: Medicare Other | Source: Ambulatory Visit | Attending: Neurology | Admitting: Neurology

## 2021-10-29 ENCOUNTER — Encounter (HOSPITAL_COMMUNITY): Payer: Self-pay

## 2021-10-29 DIAGNOSIS — G20 Parkinson's disease: Secondary | ICD-10-CM

## 2021-10-29 DIAGNOSIS — E119 Type 2 diabetes mellitus without complications: Secondary | ICD-10-CM | POA: Diagnosis not present

## 2021-10-29 HISTORY — DX: Anxiety disorder, unspecified: F41.9

## 2021-10-29 HISTORY — PX: RADIOLOGY WITH ANESTHESIA: SHX6223

## 2021-10-29 HISTORY — DX: Personal history of urinary calculi: Z87.442

## 2021-10-29 LAB — GLUCOSE, CAPILLARY
Glucose-Capillary: 148 mg/dL — ABNORMAL HIGH (ref 70–99)
Glucose-Capillary: 116 mg/dL — ABNORMAL HIGH (ref 70–99)

## 2021-10-29 IMAGING — MR MR HEAD WO/W CM
4 series · 48 of 48 positions shown · IV contrast (Yes GAD)
Comparison: Brain MRI without and with contrast [DATE].
COMPARISON: Brain MRI without and with contrast [DATE].

Addendum:
CLINICAL DATA: 65-year-old male study done under anesthesia for
deep brain stimulator stereotactic surgical planning.

EXAM:
MRI HEAD WITHOUT AND WITH CONTRAST
TECHNIQUE: Multiplanar, multiecho pulse sequences of the brain and surrounding
structures were obtained without and with intravenous contrast.
CONTRAST:  Not specified.

[Series 2: T2 · sagittal · 3.0mm · 0.47mm/px · 2 of 15 slices shown (1 of 2)]
[im 1/15]
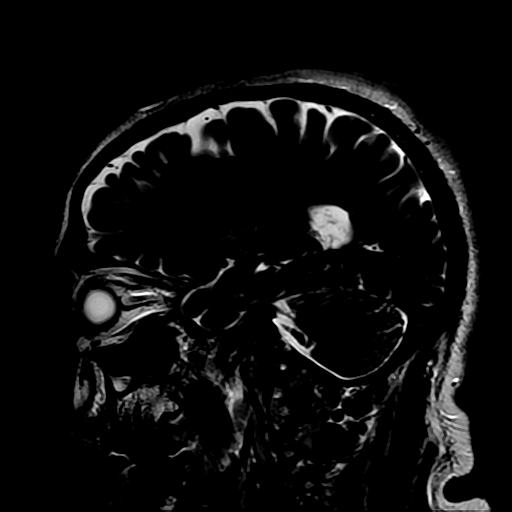
[im 15/15]
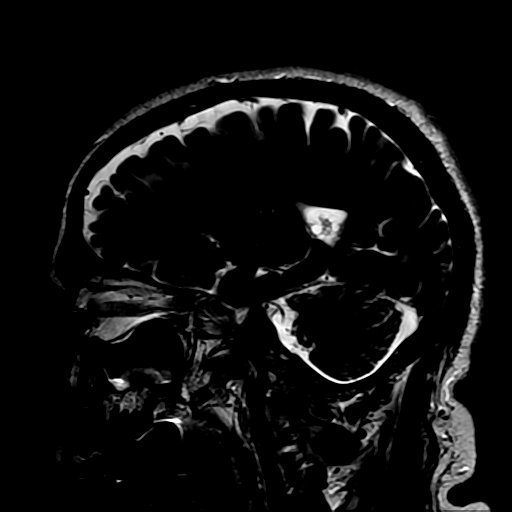

[Series 3: T1 · axial · 1.0mm · 0.94mm/px · z∈[-64,+95]mm · 16 of 160 slices shown (1 of 2)]
[im 1/160]
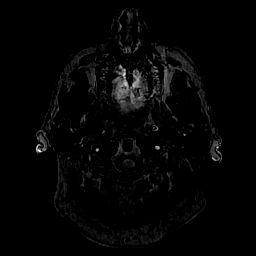
[im 11/160]
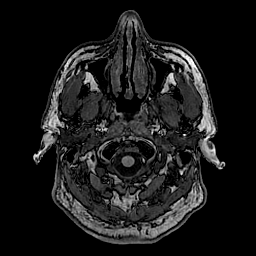
[im 22/160]
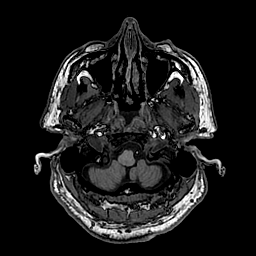
[im 32/160]
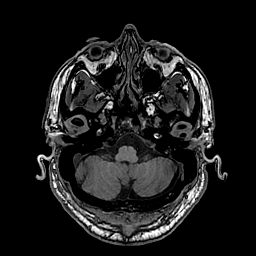
[im 43/160]
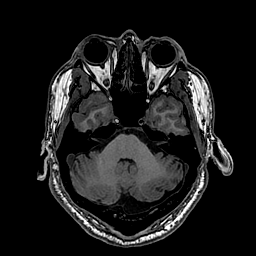
[im 54/160]
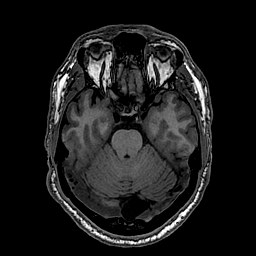
[im 64/160]
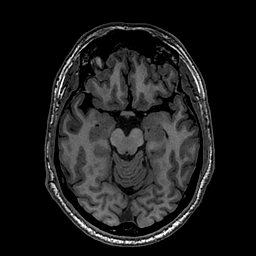
[im 75/160]
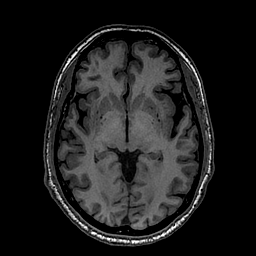
[im 85/160]
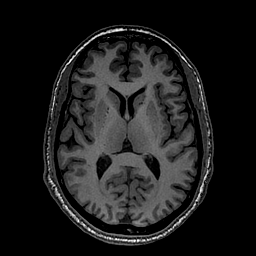
[im 96/160]
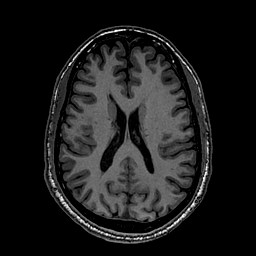
[im 107/160]
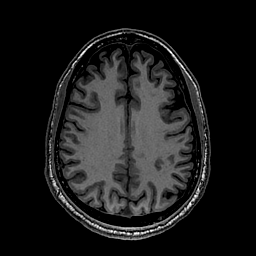
[im 117/160]
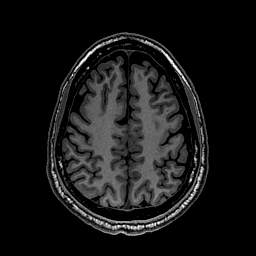
[im 128/160]
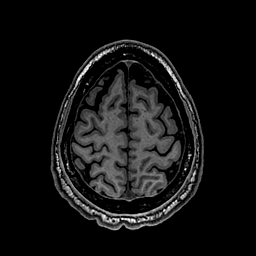
[im 138/160]
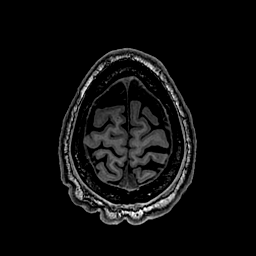
[im 149/160]
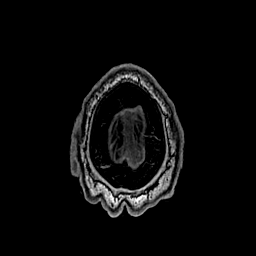
[im 160/160]
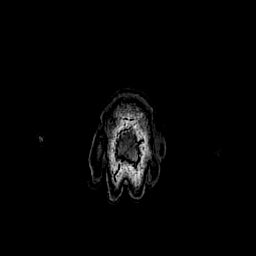

[Series 4: T2 · axial · 1.0mm · 0.94mm/px · z∈[-54,+85]mm · 14 of 140 slices shown (2 of 2)]
[im 1/140]
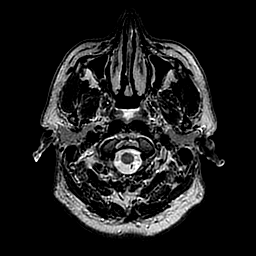
[im 11/140]
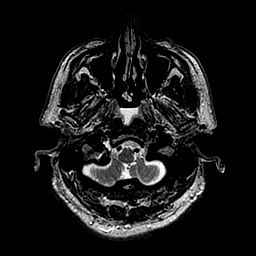
[im 22/140]
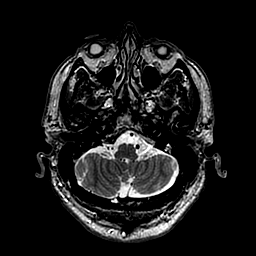
[im 33/140]
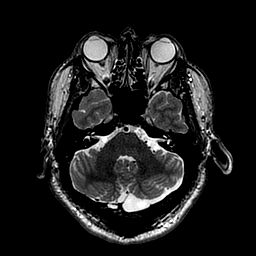
[im 43/140]
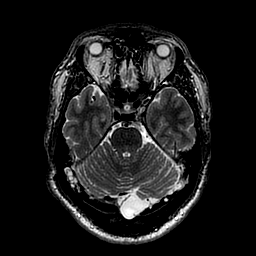
[im 54/140]
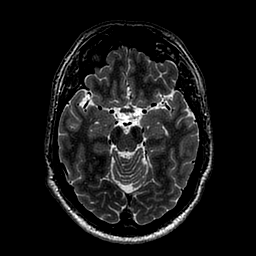
[im 65/140]
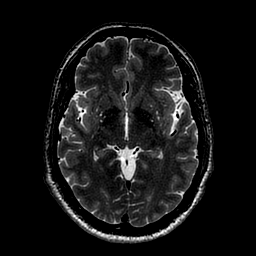
[im 75/140]
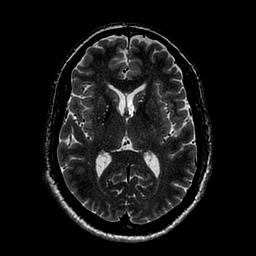
[im 86/140]
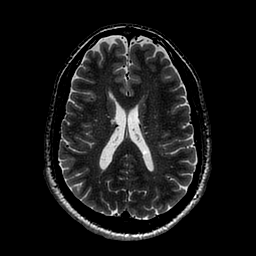
[im 97/140]
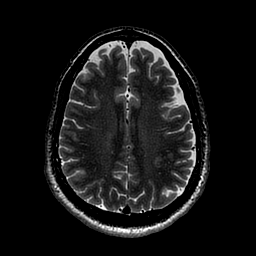
[im 107/140]
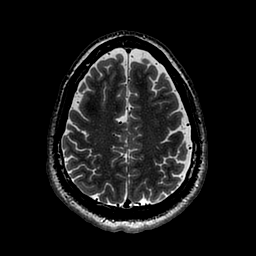
[im 118/140]
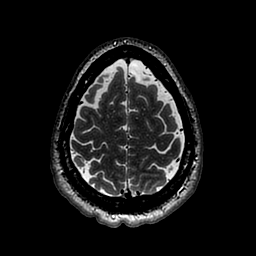
[im 129/140]
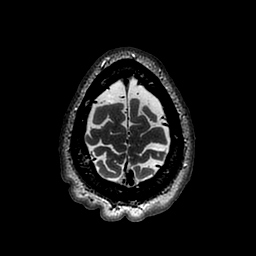
[im 140/140]
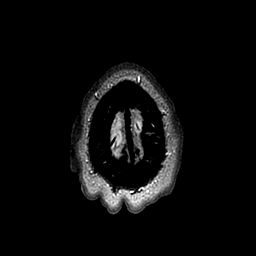

[Series 5: T1 · axial · 1.0mm · 0.94mm/px · z∈[-64,+95]mm · 16 of 160 slices shown (2 of 2)]
[im 1/160]
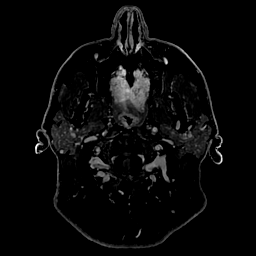
[im 11/160]
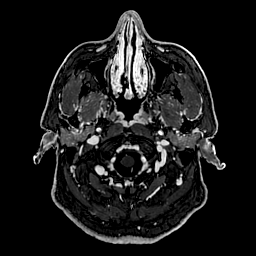
[im 22/160]
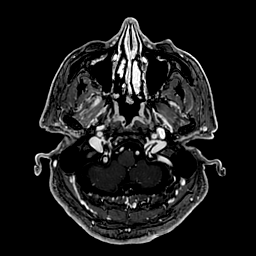
[im 32/160]
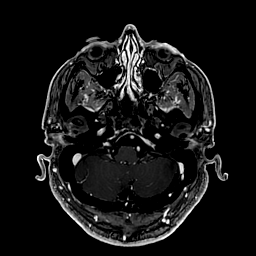
[im 43/160]
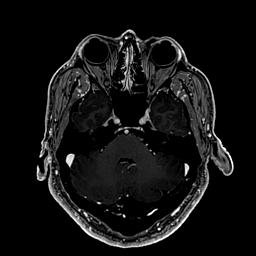
[im 54/160]
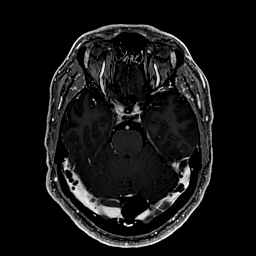
[im 64/160]
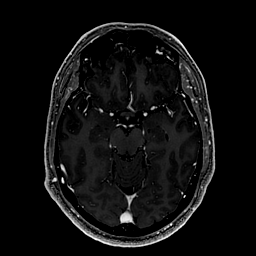
[im 75/160]
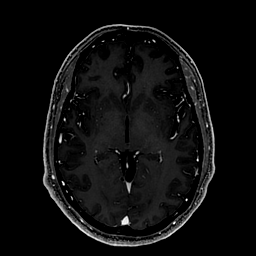
[im 85/160]
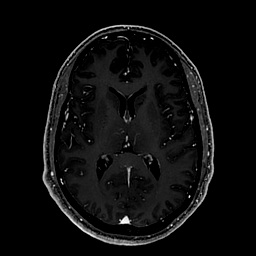
[im 96/160]
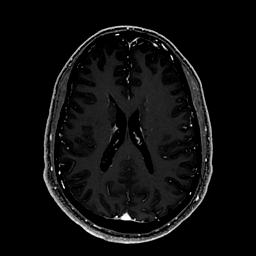
[im 107/160]
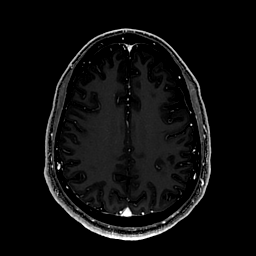
[im 117/160]
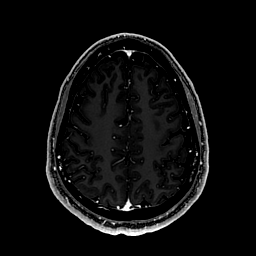
[im 128/160]
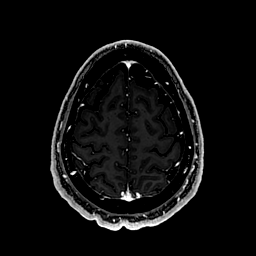
[im 138/160]
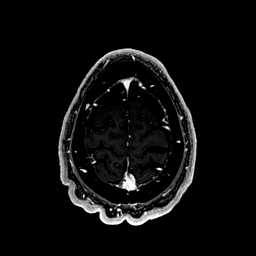
[im 149/160]
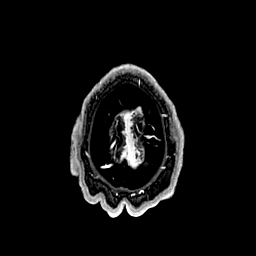
[im 160/160]
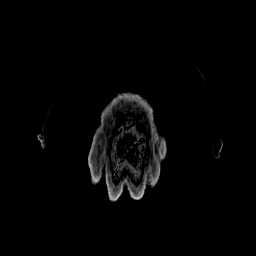

[48 of 48 positions shown; findings below may reference images not displayed]

FINDINGS: Thin slice isometric T1 pre and postcontrast, T2 imaging of the
brain.

Major intracranial vascular flow voids are preserved. And the major
dural venous sinuses are enhancing and appear to be patent.
Unchanged cerebral volume and morphology from the recent exam.

Mega cisterna magna (normal variant) favored over posterior fossa
arachnoid cyst, but inconsequential regardless. Gray and white
matter signal is within normal limits for age throughout the brain.
No abnormal enhancement identified. No dural thickening.

Intubated with small volume fluid in the pharynx. Negative visible
cervical spine, orbits (postoperative changes to the globes),
paranasal sinuses and mastoids.
IMPRESSION: Study with anesthesia for stereotactic surgical planning.

No acute intracranial abnormality, negative for age MRI appearance
of the brain.

CONTRAST:  10 mL Gadavist

*** End of Addendum ***
FINDINGS: Thin slice isometric T1 pre and postcontrast, T2 imaging of the
brain.

Major intracranial vascular flow voids are preserved. And the major
dural venous sinuses are enhancing and appear to be patent.
Unchanged cerebral volume and morphology from the recent exam.

Mega cisterna magna (normal variant) favored over posterior fossa
arachnoid cyst, but inconsequential regardless. Gray and white
matter signal is within normal limits for age throughout the brain.
No abnormal enhancement identified. No dural thickening.

Intubated with small volume fluid in the pharynx. Negative visible
cervical spine, orbits (postoperative changes to the globes),
paranasal sinuses and mastoids.
IMPRESSION: Study with anesthesia for stereotactic surgical planning.

No acute intracranial abnormality, negative for age MRI appearance
of the brain.

## 2021-10-29 SURGERY — MRI WITH ANESTHESIA
Anesthesia: General

## 2021-10-29 MED ORDER — SUGAMMADEX SODIUM 200 MG/2ML IV SOLN
INTRAVENOUS | Status: DC | PRN
  Administered 2021-10-29: 200 mg via INTRAVENOUS

## 2021-10-29 MED ORDER — LACTATED RINGERS IV SOLN: INTRAVENOUS | Status: DC

## 2021-10-29 MED ORDER — PHENYLEPHRINE HCL-NACL 20-0.9 MG/250ML-% IV SOLN
INTRAVENOUS | Status: DC | PRN
  Administered 2021-10-29: 25 ug/min via INTRAVENOUS

## 2021-10-29 MED ORDER — ROCURONIUM BROMIDE 10 MG/ML (PF) SYRINGE
PREFILLED_SYRINGE | INTRAVENOUS | Status: DC | PRN
  Administered 2021-10-29: 80 mg via INTRAVENOUS

## 2021-10-29 MED ORDER — FENTANYL CITRATE (PF) 100 MCG/2ML IJ SOLN
INTRAMUSCULAR | Status: DC | PRN
  Administered 2021-10-29: 50 ug via INTRAVENOUS

## 2021-10-29 MED ORDER — ORAL CARE MOUTH RINSE: 15.0000 mL | Freq: Once | OROMUCOSAL | Status: AC

## 2021-10-29 MED ORDER — CHLORHEXIDINE GLUCONATE 0.12 % MT SOLN
15.0000 mL | Freq: Once | OROMUCOSAL | Status: AC
  Administered 2021-10-29: 09:00:00 15 mL via OROMUCOSAL
  Filled 2021-10-29: qty 15

## 2021-10-29 MED ORDER — ONDANSETRON HCL 4 MG/2ML IJ SOLN
INTRAMUSCULAR | Status: DC | PRN
  Administered 2021-10-29: 4 mg via INTRAVENOUS

## 2021-10-29 MED ORDER — PROPOFOL 10 MG/ML IV BOLUS
INTRAVENOUS | Status: DC | PRN
  Administered 2021-10-29: 180 mg via INTRAVENOUS

## 2021-10-29 MED ORDER — GADOBUTROL 1 MMOL/ML IV SOLN
10.0000 mL | Freq: Once | INTRAVENOUS | Status: AC | PRN
  Administered 2021-10-29: 13:00:00 10 mL via INTRAVENOUS

## 2021-10-29 MED ORDER — LIDOCAINE 2% (20 MG/ML) 5 ML SYRINGE
INTRAMUSCULAR | Status: DC | PRN
  Administered 2021-10-29: 100 mg via INTRAVENOUS

## 2021-10-29 NOTE — Progress Notes (Signed)
Per Dr. Krista Blue, do not need labs.  Obtain blood glucose only.

## 2021-10-29 NOTE — Anesthesia Procedure Notes (Signed)
Procedure Name: Intubation Date/Time: 10/29/2021 9:56 AM Performed by: Barrington Ellison, CRNA Pre-anesthesia Checklist: Patient identified, Emergency Drugs available, Suction available and Patient being monitored Patient Re-evaluated:Patient Re-evaluated prior to induction Oxygen Delivery Method: Circle System Utilized Preoxygenation: Pre-oxygenation with 100% oxygen Induction Type: IV induction Ventilation: Mask ventilation without difficulty Laryngoscope Size: Mac and 4 Grade View: Grade I Tube type: Oral Tube size: 7.5 mm Number of attempts: 1 Airway Equipment and Method: Stylet and Oral airway Placement Confirmation: ETT inserted through vocal cords under direct vision, positive ETCO2 and breath sounds checked- equal and bilateral Secured at: 22 cm Tube secured with: Tape Dental Injury: Teeth and Oropharynx as per pre-operative assessment

## 2021-10-29 NOTE — Anesthesia Preprocedure Evaluation (Addendum)
Anesthesia Evaluation  Patient identified by MRN, date of birth, ID band Patient awake    Reviewed: Allergy & Precautions, NPO status , Patient's Chart, lab work & pertinent test results  History of Anesthesia Complications Negative for: history of anesthetic complications  Airway Mallampati: II  TM Distance: >3 FB Neck ROM: Full    Dental no notable dental hx. (+) Dental Advisory Given   Pulmonary neg pulmonary ROS,    Pulmonary exam normal        Cardiovascular negative cardio ROS Normal cardiovascular exam     Neuro/Psych PSYCHIATRIC DISORDERS Anxiety Parkinson"s Dz    GI/Hepatic negative GI ROS, Neg liver ROS,   Endo/Other  diabetesHypothyroidism   Renal/GU negative Renal ROS     Musculoskeletal negative musculoskeletal ROS (+)   Abdominal   Peds  Hematology negative hematology ROS (+)   Anesthesia Other Findings   Reproductive/Obstetrics                            Anesthesia Physical Anesthesia Plan  ASA: 3  Anesthesia Plan: General   Post-op Pain Management: Minimal or no pain anticipated   Induction: Intravenous  PONV Risk Score and Plan: 2 and Ondansetron, Midazolam and Diphenhydramine  Airway Management Planned: Oral ETT  Additional Equipment:   Intra-op Plan:   Post-operative Plan: Extubation in OR  Informed Consent: I have reviewed the patients History and Physical, chart, labs and discussed the procedure including the risks, benefits and alternatives for the proposed anesthesia with the patient or authorized representative who has indicated his/her understanding and acceptance.     Dental advisory given  Plan Discussed with: Anesthesiologist and CRNA  Anesthesia Plan Comments:        Anesthesia Quick Evaluation

## 2021-10-29 NOTE — Anesthesia Postprocedure Evaluation (Signed)
Anesthesia Post Note  Patient: Maxwell Tate  Procedure(s) Performed: MRI BRAIN WITH AND WITHOUT CONTRAST WITH ANESTHESIA     Patient location during evaluation: PACU Anesthesia Type: General Level of consciousness: sedated Pain management: pain level controlled Vital Signs Assessment: post-procedure vital signs reviewed and stable Respiratory status: spontaneous breathing and respiratory function stable Cardiovascular status: stable Postop Assessment: no apparent nausea or vomiting Anesthetic complications: no   No notable events documented.  Last Vitals:  Vitals:   10/29/21 1112 10/29/21 1114  BP: 119/84 124/90  Pulse: 65 64  Resp: 19 15  Temp:  36.7 C  SpO2: 93% 95%    Last Pain:  Vitals:   10/29/21 1114  TempSrc:   PainSc: 0-No pain                 Zachary Lovins DANIEL

## 2021-10-29 NOTE — Transfer of Care (Signed)
Immediate Anesthesia Transfer of Care Note  Patient: Maxwell Tate  Procedure(s) Performed: MRI BRAIN WITH AND WITHOUT CONTRAST WITH ANESTHESIA  Patient Location: PACU  Anesthesia Type:General  Level of Consciousness: awake and oriented  Airway & Oxygen Therapy: Patient Spontanous Breathing  Post-op Assessment: Report given to RN  Post vital signs: Reviewed and stable  Last Vitals:  Vitals Value Taken Time  BP 112/77 10/29/21 1057  Temp    Pulse 60 10/29/21 1058  Resp 21 10/29/21 1058  SpO2 93 % 10/29/21 1058  Vitals shown include unvalidated device data.  Last Pain:  Vitals:   10/29/21 0833  TempSrc:   PainSc: 0-No pain         Complications: No notable events documented.

## 2021-10-30 ENCOUNTER — Encounter (HOSPITAL_COMMUNITY): Payer: Self-pay | Admitting: Radiology

## 2021-11-10 ENCOUNTER — Encounter: Payer: Self-pay | Admitting: Neurology

## 2021-11-11 ENCOUNTER — Encounter: Payer: Medicare Other | Admitting: Neurology

## 2021-11-16 NOTE — Addendum Note (Signed)
Encounter addended by: Greig Castilla on: 11/16/2021 1:47 PM  Actions taken: Imaging Exam ended, Charge Capture section accepted

## 2021-11-19 ENCOUNTER — Other Ambulatory Visit: Payer: Self-pay | Admitting: Neurological Surgery

## 2021-11-19 ENCOUNTER — Encounter: Payer: Self-pay | Admitting: Neurology

## 2021-11-25 ENCOUNTER — Other Ambulatory Visit: Payer: Self-pay | Admitting: Neurological Surgery

## 2021-12-01 ENCOUNTER — Encounter (HOSPITAL_COMMUNITY)
Admission: RE | Disposition: A | Discharge status: Home / Self Care | Payer: Self-pay | Source: Home / Self Care | Attending: Neurological Surgery

## 2021-12-01 ENCOUNTER — Encounter (HOSPITAL_COMMUNITY): Payer: Self-pay

## 2021-12-01 ENCOUNTER — Ambulatory Visit (HOSPITAL_COMMUNITY)
Admission: RE | Admit: 2021-12-01 | Discharge: 2021-12-01 | Disposition: A | Discharge status: Home / Self Care | Payer: Medicare Other | Source: Ambulatory Visit | Attending: Neurological Surgery | Admitting: Neurological Surgery

## 2021-12-01 ENCOUNTER — Telehealth: Payer: Self-pay | Admitting: Neurology

## 2021-12-01 ENCOUNTER — Ambulatory Visit (HOSPITAL_COMMUNITY)
Admission: RE | Admit: 2021-12-01 | Discharge: 2021-12-01 | Disposition: A | Discharge status: Home / Self Care | Payer: Medicare Other | Attending: Neurological Surgery | Admitting: Neurological Surgery

## 2021-12-01 ENCOUNTER — Ambulatory Visit (HOSPITAL_COMMUNITY): Admission: RE | Admit: 2021-12-01 | Payer: Medicare Other | Source: Ambulatory Visit

## 2021-12-01 DIAGNOSIS — G20 Parkinson's disease: Secondary | ICD-10-CM | POA: Diagnosis not present

## 2021-12-01 HISTORY — PX: MINOR PLACEMENT OF FIDUCIAL: SHX6748

## 2021-12-01 IMAGING — CT CT DBS HEAD W/O CONTRAST
3 series · 15 of 47 positions shown, 18 images · non-contrast
Comparison: None.

CLINICAL DATA: Preoperative planning for deep brain stimulator
placement.



[Series 3: ax standard · axial · 0.50mm/px · z∈[-375,-227]mm · 9 of 172 slices shown, 12 images]
[im 12/172  brain]
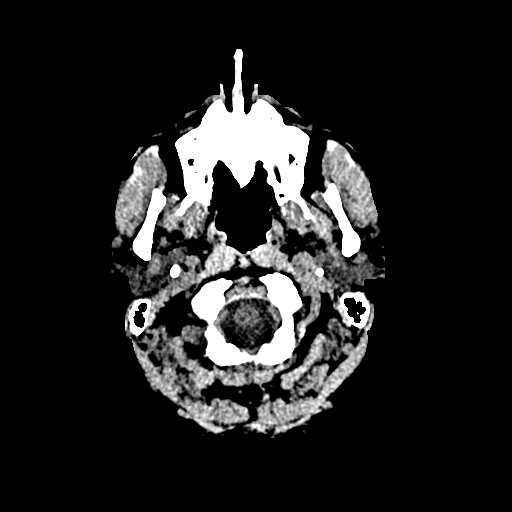
[im 12/172  bone]
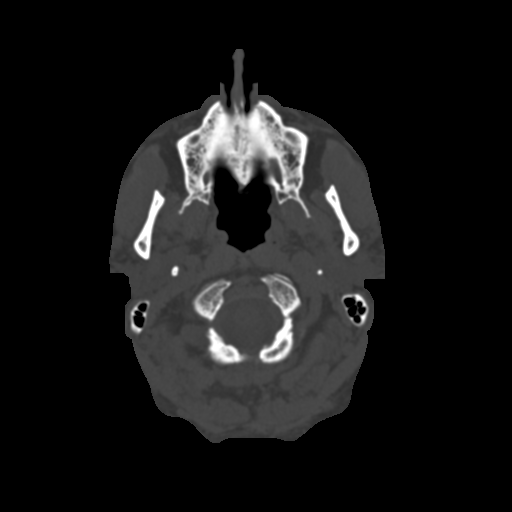
[im 30/172  brain]
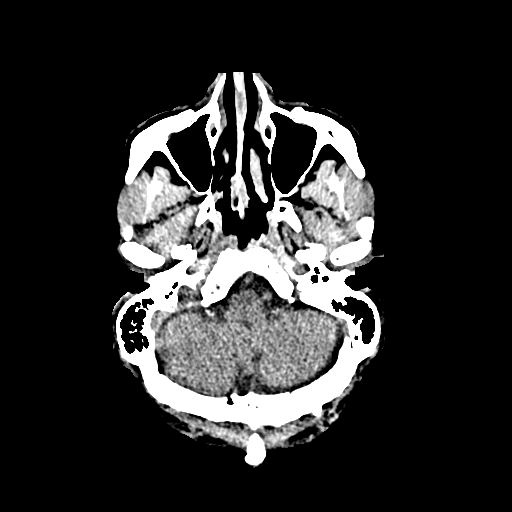
[im 48/172  brain]
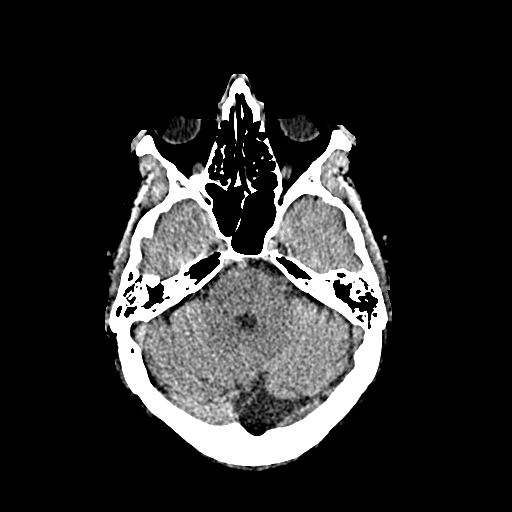
[im 65/172  brain]
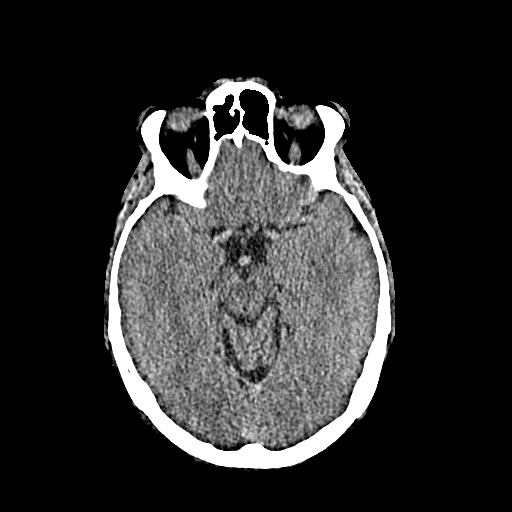
[im 89/172  brain]
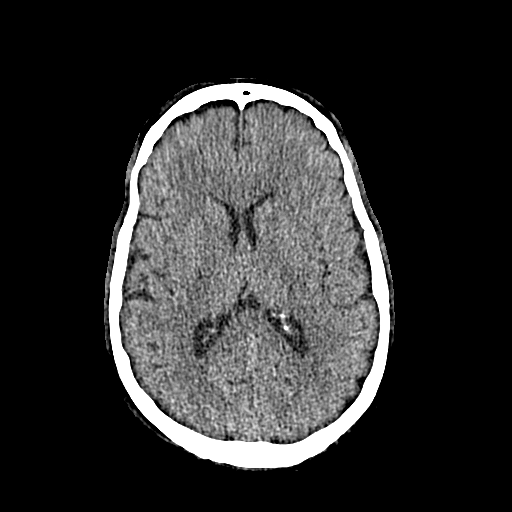
[im 89/172  bone]
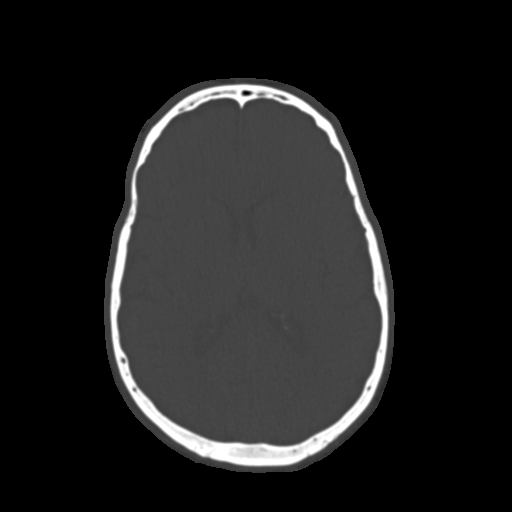
[im 107/172  brain]
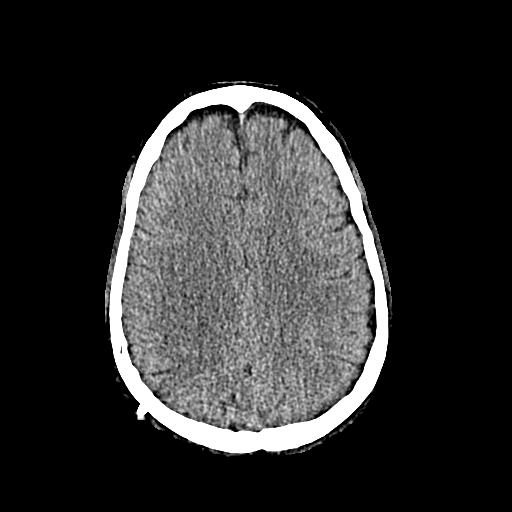
[im 124/172  brain]
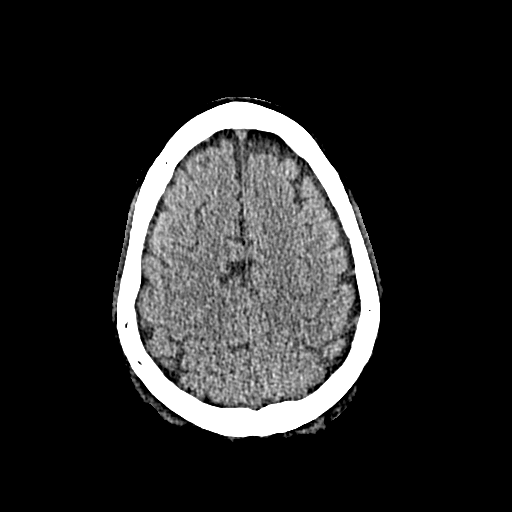
[im 142/172  brain]
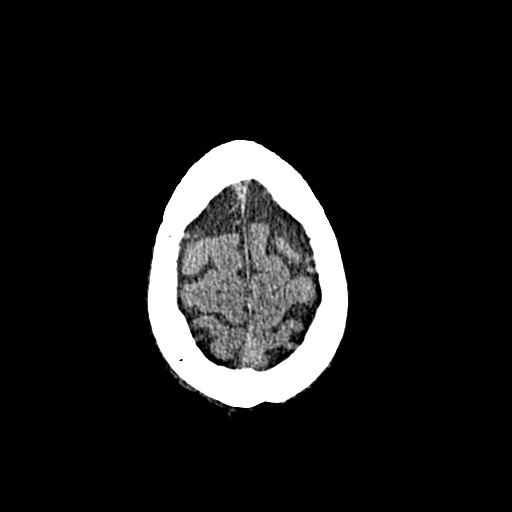
[im 160/172  brain]
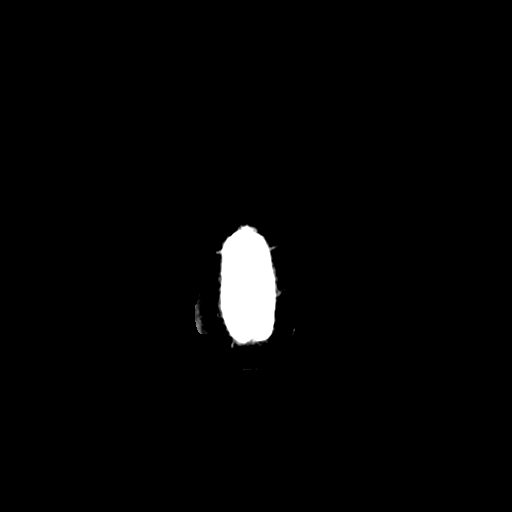
[im 160/172  bone]
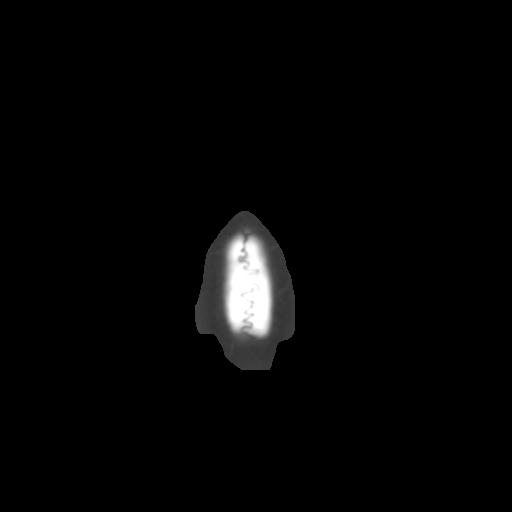

[Series 5: cor · coronal · 0.36mm/px · 3 of 233 slices shown]
[im 78/233  brain]
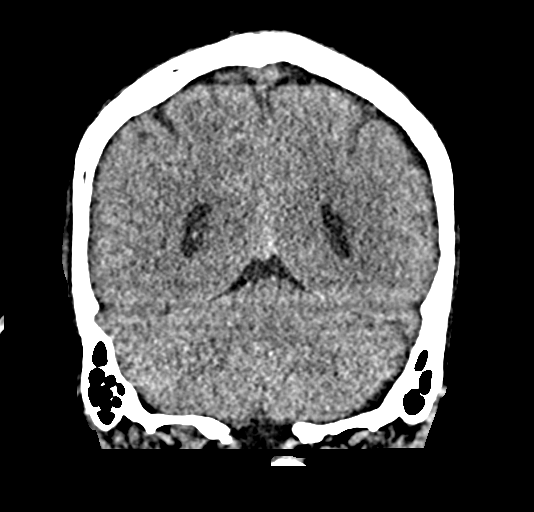
[im 104/233  brain]
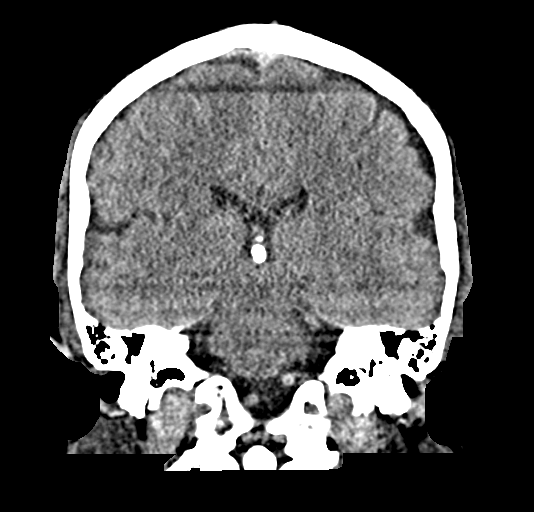
[im 129/233  brain]
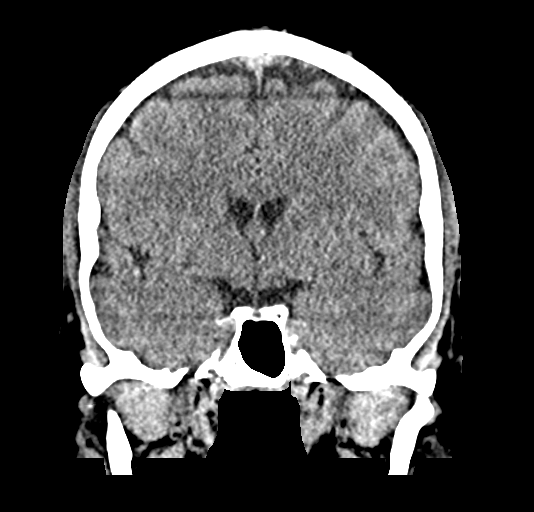

[Series 6: sag · sagittal · 0.34mm/px · 3 of 193 slices shown]
[im 65/193  brain]
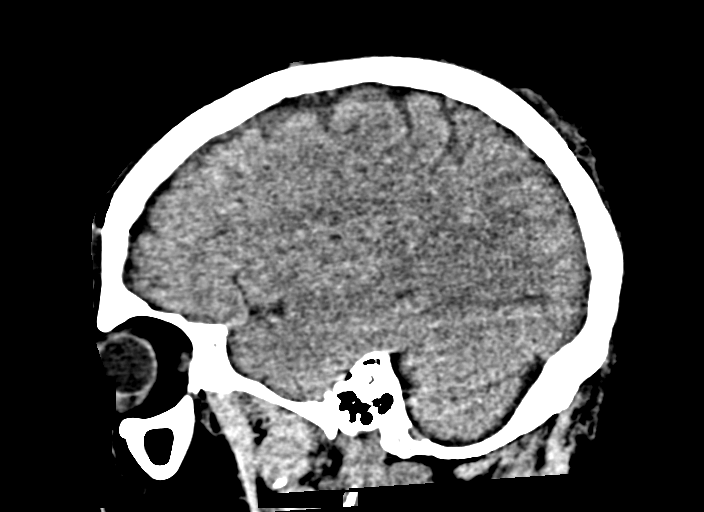
[im 97/193  brain]
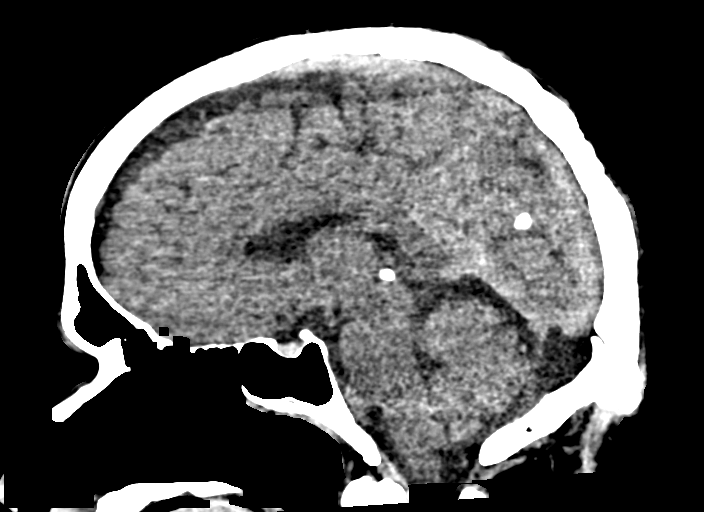
[im 129/193  brain]
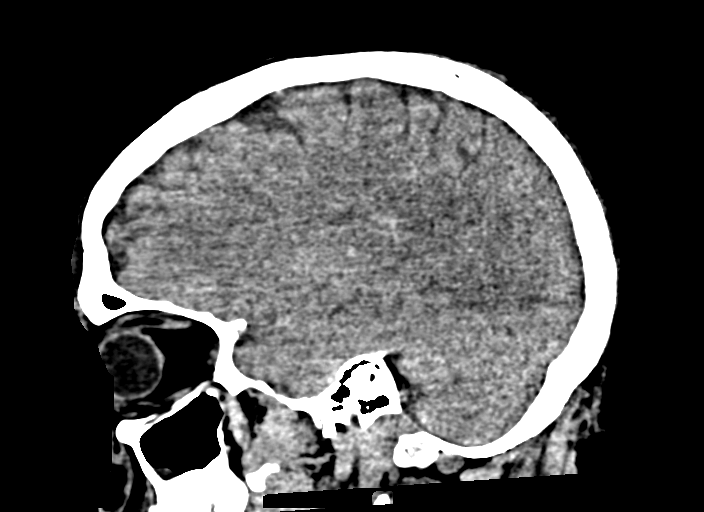

[15 of 47 positions shown; findings below may reference images not displayed]

FINDINGS: Brain: Preoperative deep brain stimulator scan with fiducials in
place. CT of the brain is normal without evidence of accelerated
atrophy, infarction, mass lesion, hemorrhage, hydrocephalus or
extra-axial collection.

Vascular: Minimal atherosclerotic calcification of the major vessels
at the base of brain.

Skull: No complication seen relative to fiducial placement.
Otherwise negative.

Sinuses/Orbits: Clear/normal

Other: None
IMPRESSION: Preoperative deep brain stimulator placement scan with fiducials in
place. No complicating feature.

Normal appearance of the brain for age by CT.

## 2021-12-01 SURGERY — MINOR PLACEMENT OF FIDUCIAL
Anesthesia: LOCAL

## 2021-12-01 MED ORDER — BACITRACIN-NEOMYCIN-POLYMYXIN OINTMENT TUBE
TOPICAL_OINTMENT | CUTANEOUS | Status: AC
  Filled 2021-12-01: qty 14.17

## 2021-12-01 MED ORDER — BACITRACIN ZINC 500 UNIT/GM EX OINT
TOPICAL_OINTMENT | CUTANEOUS | Status: AC
  Filled 2021-12-01: qty 28.35

## 2021-12-01 MED ORDER — LIDOCAINE-EPINEPHRINE 1 %-1:100000 IJ SOLN
INTRAMUSCULAR | Status: AC
  Filled 2021-12-01: qty 1

## 2021-12-01 MED ORDER — BACITRACIN-POLYMYXIN B 500-10000 UNIT/GM OP OINT
TOPICAL_OINTMENT | OPHTHALMIC | Status: AC
  Filled 2021-12-01: qty 3.5

## 2021-12-01 SURGICAL SUPPLY — 21 items
BAG ATCL THK3 35X25 (MISCELLANEOUS) ×1 IMPLANT
BAG BIOHAZARD 25X35 (MISCELLANEOUS) ×2
BAG COUNTER SPONGE SURGICOUNT (BAG) ×2 IMPLANT
BAG SURGICOUNT SPONGE COUNTING (BAG) ×1
BLADE CLIPPER SPEC (BLADE) ×3 IMPLANT
BLADE SURG 11 STRL SS (BLADE) ×3 IMPLANT
BNDG ADH 1X3 SHEER STRL LF (GAUZE/BANDAGES/DRESSINGS) ×15 IMPLANT
COVER BACK TABLE 60X90IN (DRAPES) ×3 IMPLANT
DRAPE HALF SHEET 40X57 (DRAPES) ×3 IMPLANT
DRAPE SHEET LG 3/4 BI-LAMINATE (DRAPES) ×3 IMPLANT
GAUZE SPONGE 4X4 12PLY STRL (GAUZE/BANDAGES/DRESSINGS) ×9 IMPLANT
GLOVE SRG 8 PF TXTR STRL LF DI (GLOVE) ×1 IMPLANT
GLOVE SURG LTX SZ8 (GLOVE) ×6 IMPLANT
GLOVE SURG UNDER POLY LF SZ8 (GLOVE) ×2
NEEDLE HYPO 18GX1.5 BLUNT FILL (NEEDLE) ×3 IMPLANT
NEEDLE HYPO 25X1 1.5 SAFETY (NEEDLE) ×3 IMPLANT
SOL PREP POV-IOD 4OZ 10% (MISCELLANEOUS) ×3 IMPLANT
STAPLER VISISTAT 35W (STAPLE) ×3 IMPLANT
SUT ETHILON 3 0 PS 1 (SUTURE) ×15 IMPLANT
SYR CONTROL 10ML LL (SYRINGE) ×3 IMPLANT
TOWEL GREEN STERILE (TOWEL DISPOSABLE) ×3 IMPLANT

## 2021-12-01 NOTE — Telephone Encounter (Signed)
Make sure pt is following the weaning schedule for medication for sx next week so he is off of the levodopa and pramipexole

## 2021-12-01 NOTE — Procedures (Signed)
° °  Providing Compassionate, Quality Care - Together  Date of service: 12/01/2021  PREOP DIAGNOSIS:  Parkinson's disease, right upper extremity tremor  POSTOP DIAGNOSIS: Same  PROCEDURE: Placement of fiducials, x4  SURGEON: Dr. Kendell Bane C. Panayiota Larkin, DO  ASSISTANT: Docia Barrier, NP  ANESTHESIA: Local anesthetic  EBL: Minimal  SPECIMENS: None  DRAINS: None  COMPLICATIONS: None  CONDITION: Stable  HISTORY: Maxwell Tate is a 66 y.o. male with a history of Parkinson's disease, with right upper extremity severe tremor.  Presents today for fiducial placement for planning for his left STN DBS placement at a later time.  We discussed all risks, benefits and expected outcomes, informed consent was obtained.  PROCEDURE IN DETAIL: The patient was brought to the minor procedure room.  Physician driven timeout was performed.  The head was clipped free of hair.  The 4 pin sites were sterilely prepped in a normal fashion.  Local anesthetic was injected in all 4 pin sites.  Using a 15 blade, a 0.5 cm incision was made down to the pericranium.  The fiducials were then tapped and placed with good bony purchase.  Incisions were closed with 3-0 monofilament suture.  Sterile dressing was applied.  At the end of the procedure all sponge, needle, and instrument counts were correct. The patient was then taken to CT with DBS protocol planning.  He was then discharged home with hydrocodone and Keflex.  The patient tolerated the procedure well with no complications.

## 2021-12-01 NOTE — Progress Notes (Signed)
Patient discharged to CT with Sharia Reeve, NP.

## 2021-12-01 NOTE — H&P (Signed)
° ° °  Providing Compassionate, Quality Care - Together  NEUROSURGERY HISTORY & PHYSICAL   Maxwell Tate is an 66 y.o. male.   Chief Complaint: Right upper extremity tremor, Parkinson's disease HPI: This is a 66 year old male with a history of Parkinson disease with right upper extremity tremor presents today for fiducial placement for planning of left STN DBS placement at a later time.  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   COVID 2021   cough only   History of kidney stones    Mixed hyperlipidemia    Neck pain 05/22/2020   Parkinson's disease    Personal history of colonic polyps    Primary hypothyroidism    Spinal stenosis, cervical region 05/22/2020   Type 2 diabetes mellitus without complication    Vitamin D deficiency     Past Surgical History:  Procedure Laterality Date   CARPAL TUNNEL RELEASE  2000   two weeks apart   HEMORRHOID SURGERY  2015   HEMORRHOID SURGERY  2005   KNEE SURGERY  2006   RADIOLOGY WITH ANESTHESIA N/A 10/29/2021   Procedure: MRI BRAIN WITH AND WITHOUT CONTRAST WITH ANESTHESIA;  Surgeon: Radiologist, Medication, MD;  Location: [facility];  Service: Radiology;  Laterality: N/A;   REPLACEMENT TOTAL KNEE  2021   RHINOPLASTY  1996   ROTATOR CUFF REPAIR  2006   ROTATOR CUFF REPAIR  2009   TESTICLE REMOVAL  2012    Family History  Problem Relation Age of Onset   Breast cancer Mother 32   Diabetic kidney disease Mother 49   Heart disease Father    Dementia Father        onset during mid to late 62s   Diabetes Sister    Lymphoma Brother    Parkinson's disease Maternal Grandfather    Crohn's disease Son    Social History:  reports that he has never smoked. He has never used smokeless tobacco. He reports that he does not currently use alcohol. He reports that he does not use drugs.  Allergies: No Known Allergies  No medications prior to admission.    No  results found for this or any previous visit (from the past 48 hour(s)). No results found.  ROS All positives and negatives are listed in HPI above  There were no vitals taken for this visit. Physical Exam  Awake alert oriented x3 PERRLA Cranial nerves II through XII intact Speech fluent and appropriate Right upper extremity significant resting tremor Bilateral upper/lower extremities 5/5 throughout  Assessment/Plan 66 year old male with  Parkinson's disease with right upper extremity tremor   -Under procedure today for placement of fiducials with CT DBS protocol obtainment for planning of lead placement    Thank you for allowing me to participate in this patient's care.  Please do not hesitate to call with questions or concerns.   Elwin Sleight, [facility] Cell: [phone]

## 2021-12-01 NOTE — Telephone Encounter (Signed)
My chart message has been sent to patient.

## 2021-12-02 ENCOUNTER — Encounter (HOSPITAL_COMMUNITY): Payer: Self-pay | Admitting: Neurological Surgery

## 2021-12-07 ENCOUNTER — Other Ambulatory Visit: Payer: Self-pay | Admitting: Neurological Surgery

## 2021-12-09 ENCOUNTER — Other Ambulatory Visit: Payer: Self-pay

## 2021-12-09 ENCOUNTER — Encounter (HOSPITAL_COMMUNITY): Payer: Self-pay | Admitting: Neurological Surgery

## 2021-12-09 NOTE — Anesthesia Preprocedure Evaluation (Addendum)
Anesthesia Evaluation  Patient identified by MRN, date of birth, ID band Patient awake    Reviewed: Allergy & Precautions, H&P , NPO status , Patient's Chart, lab work & pertinent test results  Airway Mallampati: II  TM Distance: >3 FB Neck ROM: Full    Dental no notable dental hx. (+) Teeth Intact, Dental Advisory Given   Pulmonary neg pulmonary ROS,    Pulmonary exam normal breath sounds clear to auscultation       Cardiovascular Exercise Tolerance: Good negative cardio ROS   Rhythm:Regular Rate:Normal     Neuro/Psych Anxiety  Neuromuscular disease    GI/Hepatic negative GI ROS, Neg liver ROS,   Endo/Other  diabetes, Type 2, Oral Hypoglycemic AgentsHypothyroidism   Renal/GU negative Renal ROS  negative genitourinary   Musculoskeletal  (+) Arthritis , Osteoarthritis,    Abdominal   Peds  Hematology negative hematology ROS (+)   Anesthesia Other Findings   Reproductive/Obstetrics negative OB ROS                            Anesthesia Physical Anesthesia Plan  ASA: 3  Anesthesia Plan: MAC   Post-op Pain Management: Minimal or no pain anticipated   Induction: Intravenous  PONV Risk Score and Plan: 2 and Treatment may vary due to age or medical condition  Airway Management Planned: Natural Airway and Simple Face Mask  Additional Equipment:   Intra-op Plan:   Post-operative Plan:   Informed Consent: I have reviewed the patients History and Physical, chart, labs and discussed the procedure including the risks, benefits and alternatives for the proposed anesthesia with the patient or authorized representative who has indicated his/her understanding and acceptance.     Dental advisory given  Plan Discussed with: CRNA  Anesthesia Plan Comments:        Anesthesia Quick Evaluation

## 2021-12-09 NOTE — Progress Notes (Signed)
Patient voiced understanding of arrival time of 0530 tomorrow 12/10/2021 at [address] [place], [place] [address], entrance A. Patient voiced understanding of hygiene and belongings instructions.  Voiced understanding of NPO at midnight, clear liquids okay until 0430  Instructed that he may take Lipitor, synthroid, myrbetriq Patient was instructed by Dr. Arbutus Leas to stop taking entacapone and sinemet on 1/31 Instructed to NOT take DOS: vitamin D, multivitamin, Voltaren, viagra, junamet  Patient stated he and his wife tested positive for covid at home using home tests on 11/10/2021. He denies having and covid symptoms at this time.

## 2021-12-10 ENCOUNTER — Inpatient Hospital Stay (HOSPITAL_COMMUNITY)
Admission: RE | Disposition: A | Discharge status: Home / Self Care | Payer: Self-pay | Source: Ambulatory Visit | Attending: Neurological Surgery

## 2021-12-10 ENCOUNTER — Observation Stay (HOSPITAL_COMMUNITY): Payer: Medicare Other

## 2021-12-10 ENCOUNTER — Other Ambulatory Visit: Payer: Self-pay

## 2021-12-10 ENCOUNTER — Inpatient Hospital Stay (HOSPITAL_COMMUNITY): Payer: Medicare Other | Admitting: Anesthesiology

## 2021-12-10 ENCOUNTER — Inpatient Hospital Stay (HOSPITAL_COMMUNITY)
Admission: RE | Admit: 2021-12-10 | Discharge: 2021-12-11 | DRG: 027 | Disposition: A | Discharge status: Home / Self Care | Payer: Medicare Other | Source: Ambulatory Visit | Attending: Neurological Surgery | Admitting: Neurological Surgery

## 2021-12-10 DIAGNOSIS — Z9889 Other specified postprocedural states: Secondary | ICD-10-CM

## 2021-12-10 DIAGNOSIS — Z7984 Long term (current) use of oral hypoglycemic drugs: Secondary | ICD-10-CM

## 2021-12-10 DIAGNOSIS — E782 Mixed hyperlipidemia: Secondary | ICD-10-CM | POA: Diagnosis present

## 2021-12-10 DIAGNOSIS — Z79890 Hormone replacement therapy (postmenopausal): Secondary | ICD-10-CM

## 2021-12-10 DIAGNOSIS — Z820 Family history of epilepsy and other diseases of the nervous system: Secondary | ICD-10-CM | POA: Diagnosis not present

## 2021-12-10 DIAGNOSIS — E039 Hypothyroidism, unspecified: Secondary | ICD-10-CM | POA: Diagnosis present

## 2021-12-10 DIAGNOSIS — Z833 Family history of diabetes mellitus: Secondary | ICD-10-CM | POA: Diagnosis not present

## 2021-12-10 DIAGNOSIS — Z841 Family history of disorders of kidney and ureter: Secondary | ICD-10-CM

## 2021-12-10 DIAGNOSIS — Z8616 Personal history of COVID-19: Secondary | ICD-10-CM | POA: Diagnosis not present

## 2021-12-10 DIAGNOSIS — Z807 Family history of other malignant neoplasms of lymphoid, hematopoietic and related tissues: Secondary | ICD-10-CM

## 2021-12-10 DIAGNOSIS — E119 Type 2 diabetes mellitus without complications: Secondary | ICD-10-CM | POA: Diagnosis present

## 2021-12-10 DIAGNOSIS — E559 Vitamin D deficiency, unspecified: Secondary | ICD-10-CM | POA: Diagnosis present

## 2021-12-10 DIAGNOSIS — Z79899 Other long term (current) drug therapy: Secondary | ICD-10-CM

## 2021-12-10 DIAGNOSIS — Z8249 Family history of ischemic heart disease and other diseases of the circulatory system: Secondary | ICD-10-CM

## 2021-12-10 DIAGNOSIS — Z803 Family history of malignant neoplasm of breast: Secondary | ICD-10-CM

## 2021-12-10 DIAGNOSIS — G20 Parkinson's disease: Principal | ICD-10-CM | POA: Diagnosis present

## 2021-12-10 HISTORY — PX: SUBTHALAMIC STIMULATOR INSERTION: SHX5375

## 2021-12-10 LAB — CBC
HCT: 45.2 % (ref 39.0–52.0)
Hemoglobin: 15.1 g/dL (ref 13.0–17.0)
MCH: 30.7 pg (ref 26.0–34.0)
MCHC: 33.4 g/dL (ref 30.0–36.0)
MCV: 91.9 fL (ref 80.0–100.0)
Platelets: 263 10*3/uL (ref 150–400)
RBC: 4.92 MIL/uL (ref 4.22–5.81)
RDW: 13.6 % (ref 11.5–15.5)
WBC: 5.1 10*3/uL (ref 4.0–10.5)
nRBC: 0 % (ref 0.0–0.2)

## 2021-12-10 LAB — BASIC METABOLIC PANEL
Anion gap: 8 (ref 5–15)
BUN: 19 mg/dL (ref 8–23)
CO2: 26 mmol/L (ref 22–32)
Calcium: 9.5 mg/dL (ref 8.9–10.3)
Chloride: 105 mmol/L (ref 98–111)
Creatinine, Ser: 1.01 mg/dL (ref 0.61–1.24)
GFR, Estimated: >60 mL/min (ref >60)
Glucose, Bld: 141 mg/dL — ABNORMAL HIGH (ref 70–99)
Potassium: 4.2 mmol/L (ref 3.5–5.1)
Sodium: 139 mmol/L (ref 135–145)

## 2021-12-10 LAB — GLUCOSE, CAPILLARY
Glucose-Capillary: 150 mg/dL — ABNORMAL HIGH (ref 70–99)
Glucose-Capillary: 147 mg/dL — ABNORMAL HIGH (ref 70–99)
Glucose-Capillary: 119 mg/dL — ABNORMAL HIGH (ref 70–99)
Glucose-Capillary: 200 mg/dL — ABNORMAL HIGH (ref 70–99)

## 2021-12-10 IMAGING — CT CT DBS HEAD W/O CONTRAST
3 series · 15 of 47 positions shown, 18 images · non-contrast
Comparison: CT [DATE].

CLINICAL DATA: Preop planning.



[Series 3: ax standard · axial · 0.44mm/px · z∈[-240,-78]mm · 9 of 188 slices shown, 12 images]
[im 13/188  brain]
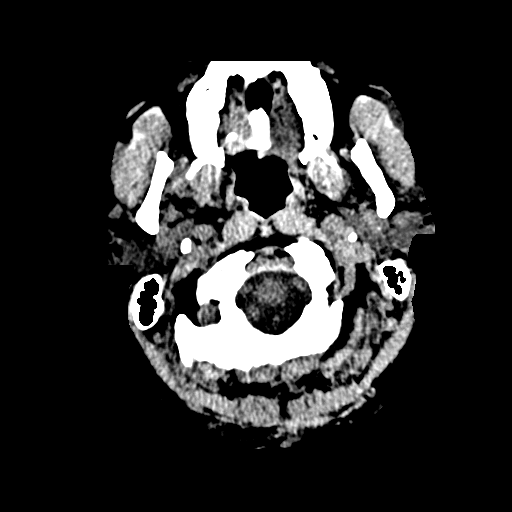
[im 13/188  bone]
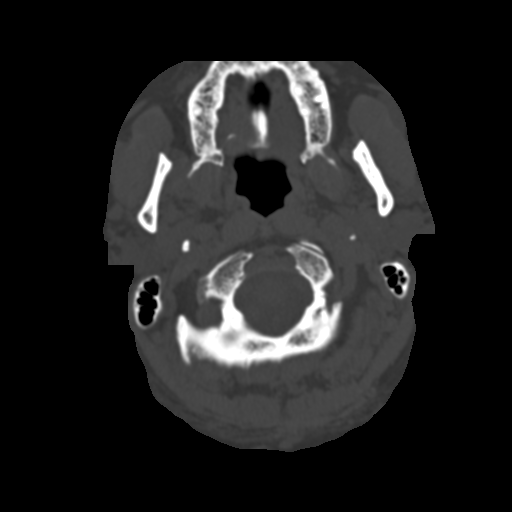
[im 33/188  brain]
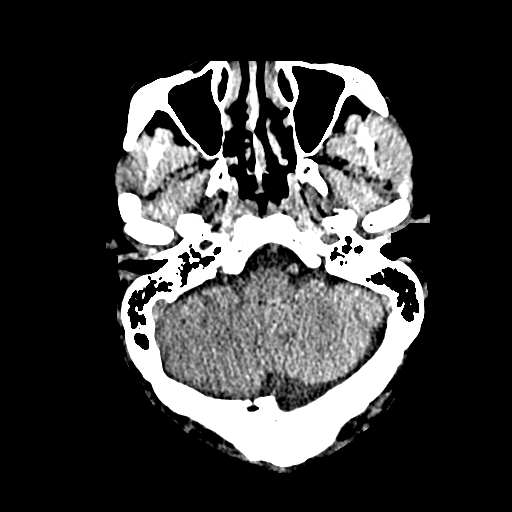
[im 52/188  brain]
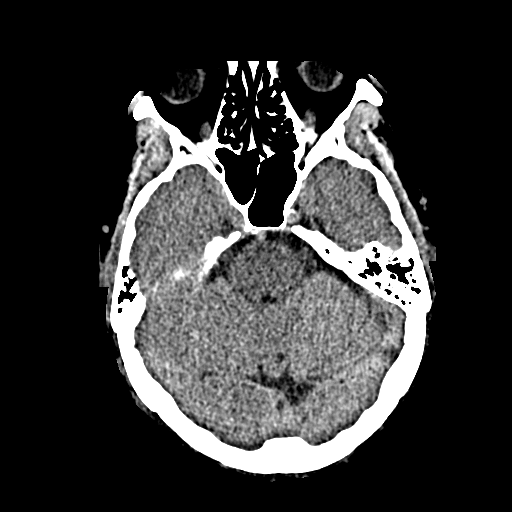
[im 71/188  brain]
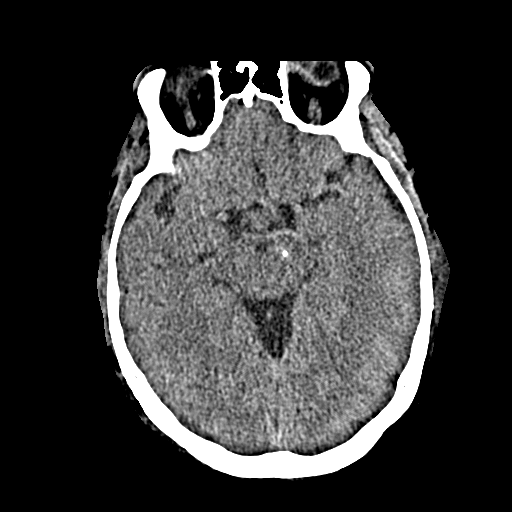
[im 97/188  brain]
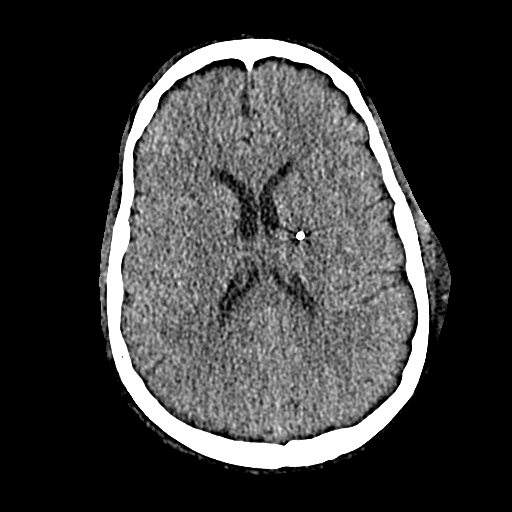
[im 97/188  bone]
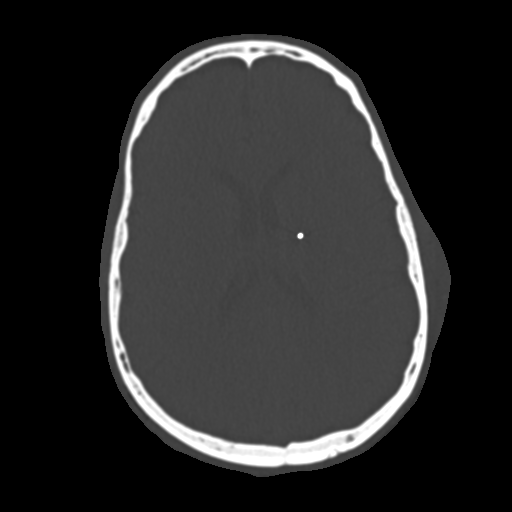
[im 117/188  brain]
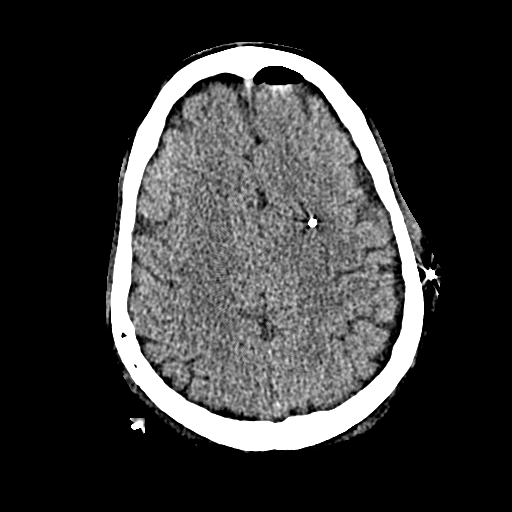
[im 136/188  brain]
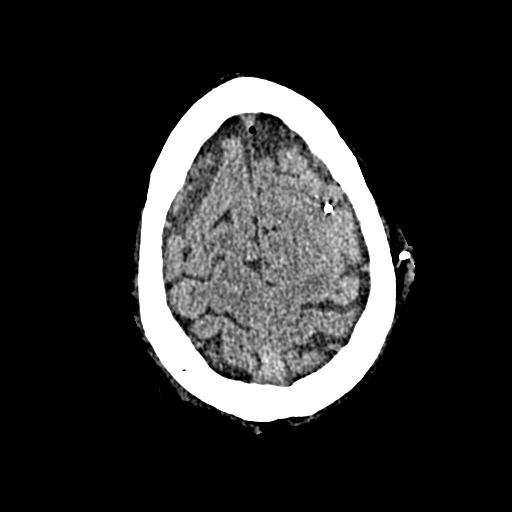
[im 155/188  brain]
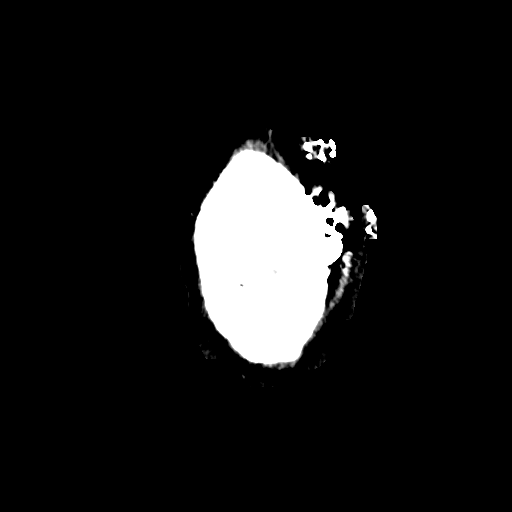
[im 175/188  brain]
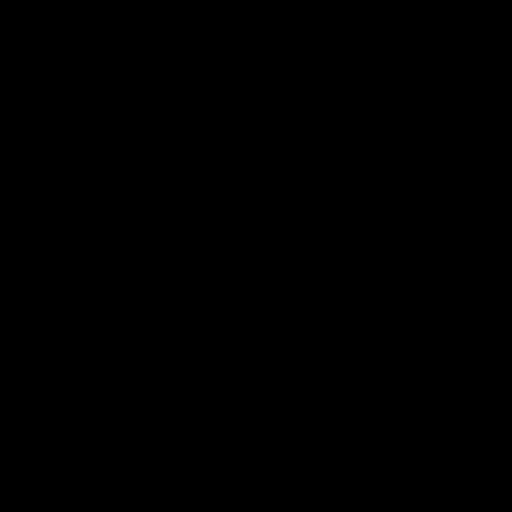
[im 175/188  bone]
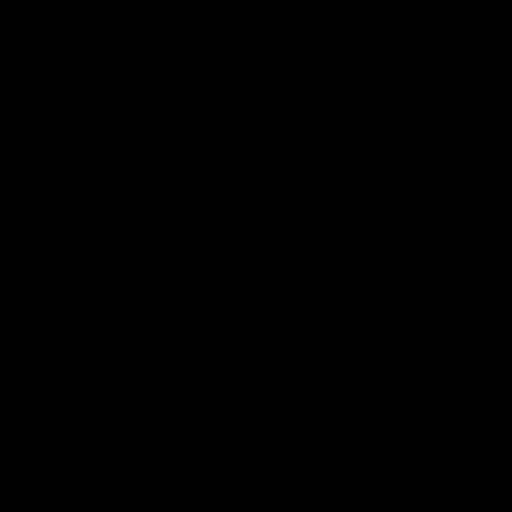

[Series 5: cor · coronal · 0.36mm/px · 3 of 229 slices shown]
[im 77/229  brain]
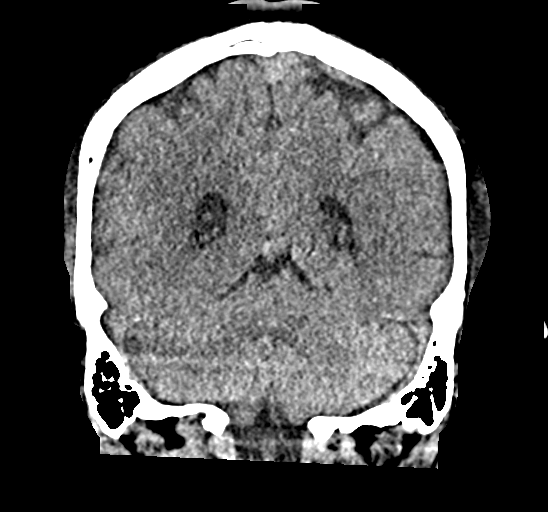
[im 102/229  brain]
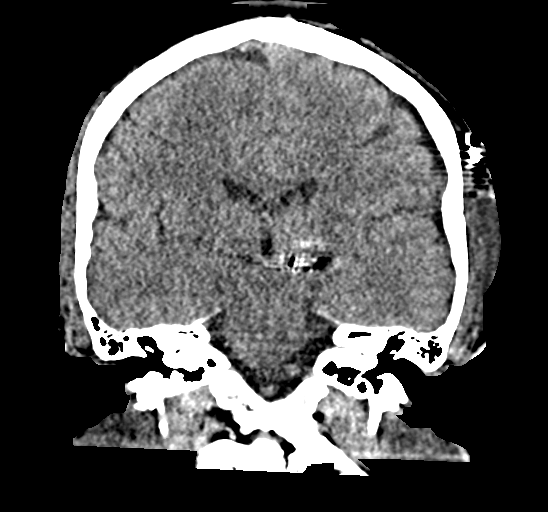
[im 127/229  brain]
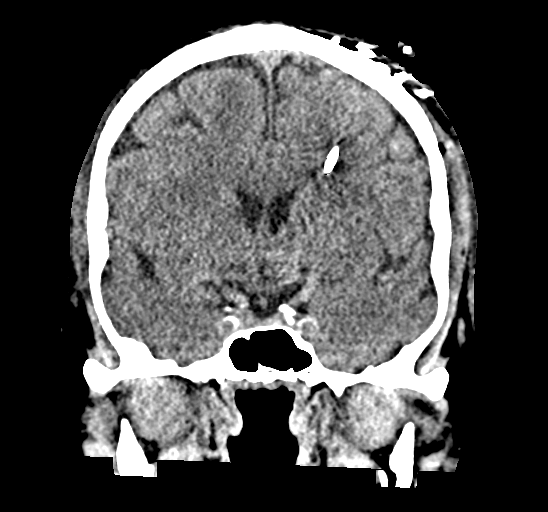

[Series 6: sag · sagittal · 0.36mm/px · 3 of 196 slices shown]
[im 66/196  brain]
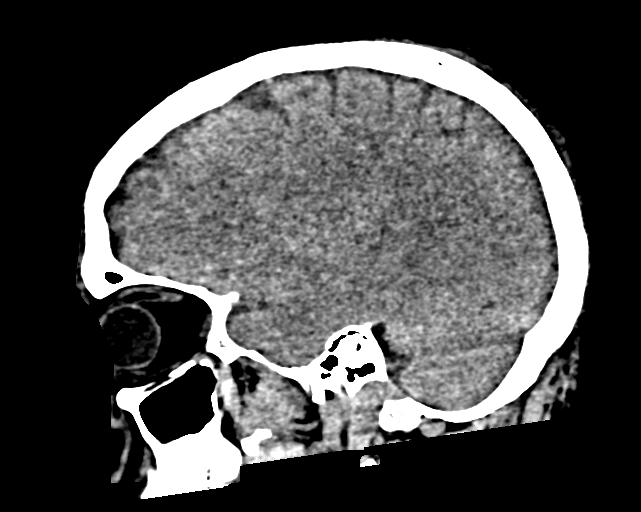
[im 98/196  brain]
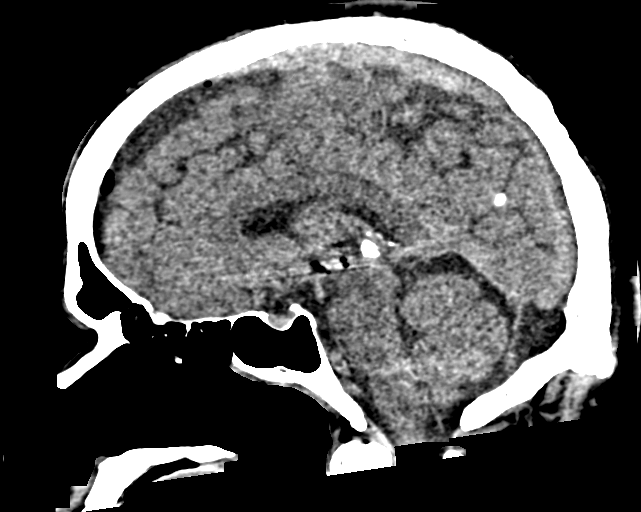
[im 131/196  brain]
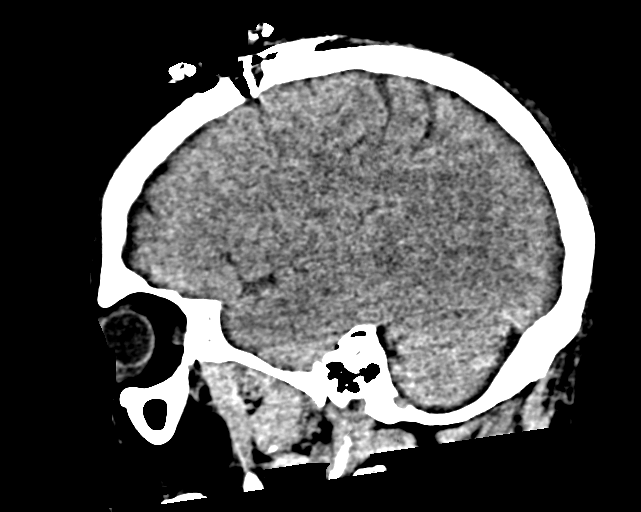

[15 of 47 positions shown; findings below may reference images not displayed]

FINDINGS: Limited preoperative planning CT.  Within this limitation:

Brain: No evidence of acute infarction, hemorrhage, hydrocephalus,
extra-axial collection or mass lesion/mass effect. DAGSLAND
with tip projecting in the region of the subthalamic nucleus. No
evidence of lead discontinuity or preschool surrounding brain edema.

Vascular: No hyperdense vessel identified.

Skull: Left frontal burr hole. Left scalp contusions/gas, presumably
related to recent DAGSLAND placement.

Sinuses/Orbits: Mild paranasal sinus mucosal thickening.
Unremarkable orbits.

Other: No mastoid effusions.
IMPRESSION: 1. Limited preoperative planning CT without evidence of acute
abnormality.
2. DAGSLAND with tip projecting in the region of the left
subthalamic nucleus.

## 2021-12-10 SURGERY — SUBTHALAMIC STIMULATOR INSERTION
Anesthesia: Monitor Anesthesia Care | Site: Head | Laterality: Left

## 2021-12-10 MED ORDER — CHLORHEXIDINE GLUCONATE 0.12 % MT SOLN
OROMUCOSAL | Status: AC
  Filled 2021-12-10: qty 15

## 2021-12-10 MED ORDER — CEFAZOLIN SODIUM-DEXTROSE 2-4 GM/100ML-% IV SOLN
2.0000 g | Freq: Three times a day (TID) | INTRAVENOUS | Status: AC
  Administered 2021-12-10 (×2): 2 g via INTRAVENOUS
  Filled 2021-12-10 (×2): qty 100

## 2021-12-10 MED ORDER — PANTOPRAZOLE SODIUM 40 MG PO TBEC: 40.0000 mg | DELAYED_RELEASE_TABLET | Freq: Every day | ORAL | Status: DC

## 2021-12-10 MED ORDER — METFORMIN HCL 500 MG PO TABS
1000.0000 mg | ORAL_TABLET | Freq: Every day | ORAL | Status: DC
  Administered 2021-12-11: 1000 mg via ORAL
  Filled 2021-12-10: qty 2

## 2021-12-10 MED ORDER — SODIUM BICARBONATE 4.2 % IV SOLN
INTRAVENOUS | Status: DC | PRN
  Administered 2021-12-10: 5 mL via INTRAVENOUS

## 2021-12-10 MED ORDER — DOCUSATE SODIUM 100 MG PO CAPS
100.0000 mg | ORAL_CAPSULE | Freq: Two times a day (BID) | ORAL | Status: DC
  Administered 2021-12-10 – 2021-12-11 (×3): 100 mg via ORAL
  Filled 2021-12-10 (×3): qty 1

## 2021-12-10 MED ORDER — PANTOPRAZOLE SODIUM 40 MG IV SOLR: 40.0000 mg | Freq: Every day | INTRAVENOUS | Status: DC

## 2021-12-10 MED ORDER — CARBIDOPA-LEVODOPA ER 25-100 MG PO TBCR
2.0000 | EXTENDED_RELEASE_TABLET | ORAL | Status: DC
  Administered 2021-12-10 – 2021-12-11 (×4): 2 via ORAL
  Filled 2021-12-10 (×5): qty 2

## 2021-12-10 MED ORDER — BACITRACIN ZINC 500 UNIT/GM EX OINT
TOPICAL_OINTMENT | CUTANEOUS | Status: AC
  Filled 2021-12-10: qty 28.35

## 2021-12-10 MED ORDER — THROMBIN 5000 UNITS EX SOLR
CUTANEOUS | Status: DC | PRN
  Administered 2021-12-10 (×2): 5000 [IU] via TOPICAL

## 2021-12-10 MED ORDER — LIDOCAINE-EPINEPHRINE 1 %-1:100000 IJ SOLN
INTRAMUSCULAR | Status: AC
  Filled 2021-12-10: qty 2

## 2021-12-10 MED ORDER — SODIUM CHLORIDE 0.9 % IV SOLN
0.1000 ug/kg/min | INTRAVENOUS | Status: AC
  Administered 2021-12-10: .05 ug/kg/min via INTRAVENOUS
  Filled 2021-12-10: qty 1000

## 2021-12-10 MED ORDER — HEMOSTATIC AGENTS (NO CHARGE) OPTIME
TOPICAL | Status: DC | PRN
  Administered 2021-12-10: 1 via TOPICAL

## 2021-12-10 MED ORDER — ATORVASTATIN CALCIUM 40 MG PO TABS
40.0000 mg | ORAL_TABLET | Freq: Every morning | ORAL | Status: DC
  Administered 2021-12-10 – 2021-12-11 (×2): 40 mg via ORAL
  Filled 2021-12-10 (×2): qty 1

## 2021-12-10 MED ORDER — CHLORHEXIDINE GLUCONATE CLOTH 2 % EX PADS: 6.0000 | MEDICATED_PAD | Freq: Once | CUTANEOUS | Status: DC

## 2021-12-10 MED ORDER — ACETAMINOPHEN 10 MG/ML IV SOLN
INTRAVENOUS | Status: DC | PRN
Start: 2021-12-10 — End: 2021-12-10
  Administered 2021-12-10: 1000 mg via INTRAVENOUS

## 2021-12-10 MED ORDER — CHLORHEXIDINE GLUCONATE 0.12 % MT SOLN
15.0000 mL | Freq: Once | OROMUCOSAL | Status: AC
  Administered 2021-12-10: 15 mL via OROMUCOSAL

## 2021-12-10 MED ORDER — ORAL CARE MOUTH RINSE: 15.0000 mL | Freq: Once | OROMUCOSAL | Status: AC

## 2021-12-10 MED ORDER — SODIUM CHLORIDE 0.9 % IV SOLN: INTRAVENOUS | Status: DC | PRN

## 2021-12-10 MED ORDER — ACETAMINOPHEN 10 MG/ML IV SOLN
INTRAVENOUS | Status: AC
  Filled 2021-12-10: qty 100

## 2021-12-10 MED ORDER — THROMBIN 5000 UNITS EX SOLR
CUTANEOUS | Status: AC
  Filled 2021-12-10: qty 5000

## 2021-12-10 MED ORDER — ACETAMINOPHEN 325 MG PO TABS
650.0000 mg | ORAL_TABLET | ORAL | Status: DC | PRN
  Administered 2021-12-11 (×2): 650 mg via ORAL
  Filled 2021-12-10 (×2): qty 2

## 2021-12-10 MED ORDER — ACETAMINOPHEN 650 MG RE SUPP: 650.0000 mg | RECTAL | Status: DC | PRN

## 2021-12-10 MED ORDER — ONDANSETRON HCL 4 MG/2ML IJ SOLN: 4.0000 mg | INTRAMUSCULAR | Status: DC | PRN

## 2021-12-10 MED ORDER — BACITRACIN ZINC 500 UNIT/GM EX OINT
TOPICAL_OINTMENT | CUTANEOUS | Status: DC | PRN
Start: 2021-12-10 — End: 2021-12-10
  Administered 2021-12-10: 1 via TOPICAL

## 2021-12-10 MED ORDER — MORPHINE SULFATE (PF) 2 MG/ML IV SOLN
1.0000 mg | INTRAVENOUS | Status: DC | PRN
  Administered 2021-12-10: 2 mg via INTRAVENOUS
  Filled 2021-12-10: qty 1

## 2021-12-10 MED ORDER — THROMBIN 5000 UNITS EX SOLR: OROMUCOSAL | Status: DC | PRN

## 2021-12-10 MED ORDER — FENTANYL CITRATE (PF) 100 MCG/2ML IJ SOLN: 25.0000 ug | INTRAMUSCULAR | Status: DC | PRN

## 2021-12-10 MED ORDER — BUPIVACAINE-EPINEPHRINE 0.5% -1:200000 IJ SOLN
INTRAMUSCULAR | Status: AC
  Filled 2021-12-10: qty 1

## 2021-12-10 MED ORDER — THROMBIN 5000 UNITS EX SOLR
CUTANEOUS | Status: AC
  Filled 2021-12-10: qty 15000

## 2021-12-10 MED ORDER — BUPIVACAINE HCL (PF) 0.5 % IJ SOLN
INTRAMUSCULAR | Status: AC
  Filled 2021-12-10: qty 30

## 2021-12-10 MED ORDER — HYDROMORPHONE HCL 1 MG/ML IJ SOLN
INTRAMUSCULAR | Status: DC | PRN
  Administered 2021-12-10: .5 mg via INTRAVENOUS

## 2021-12-10 MED ORDER — BUPIVACAINE HCL 0.5 % IJ SOLN
INTRAMUSCULAR | Status: DC | PRN
Start: 2021-12-10 — End: 2021-12-10
  Administered 2021-12-10: 20 mL

## 2021-12-10 MED ORDER — CEFAZOLIN SODIUM-DEXTROSE 2-4 GM/100ML-% IV SOLN
2.0000 g | INTRAVENOUS | Status: AC
  Administered 2021-12-10: 2 g via INTRAVENOUS
  Filled 2021-12-10: qty 100

## 2021-12-10 MED ORDER — PANTOPRAZOLE SODIUM 40 MG PO TBEC
40.0000 mg | DELAYED_RELEASE_TABLET | Freq: Every day | ORAL | Status: DC
  Administered 2021-12-10 – 2021-12-11 (×2): 40 mg via ORAL
  Filled 2021-12-10 (×2): qty 1

## 2021-12-10 MED ORDER — PHENYLEPHRINE HCL (PRESSORS) 10 MG/ML IV SOLN
INTRAVENOUS | Status: AC
  Filled 2021-12-10: qty 1

## 2021-12-10 MED ORDER — LIDOCAINE-EPINEPHRINE 1 %-1:100000 IJ SOLN
INTRAMUSCULAR | Status: DC | PRN
  Administered 2021-12-10: 25 mL

## 2021-12-10 MED ORDER — CEFAZOLIN SODIUM-DEXTROSE 2-4 GM/100ML-% IV SOLN: 2.0000 g | INTRAVENOUS | Status: DC

## 2021-12-10 MED ORDER — LEVOTHYROXINE SODIUM 88 MCG PO TABS
88.0000 ug | ORAL_TABLET | Freq: Every day | ORAL | Status: DC
  Administered 2021-12-10 – 2021-12-11 (×2): 88 ug via ORAL
  Filled 2021-12-10 (×2): qty 1

## 2021-12-10 MED ORDER — HYDROMORPHONE HCL 1 MG/ML IJ SOLN
INTRAMUSCULAR | Status: AC
  Filled 2021-12-10: qty 0.5

## 2021-12-10 MED ORDER — SITAGLIPTIN PHOS-METFORMIN HCL 50-1000 MG PO TABS: 1.0000 | ORAL_TABLET | Freq: Every morning | ORAL | Status: DC

## 2021-12-10 MED ORDER — CARBIDOPA-LEVODOPA ER 25-100 MG PO TBCR
2.0000 | EXTENDED_RELEASE_TABLET | Freq: Two times a day (BID) | ORAL | Status: DC
  Filled 2021-12-10 (×2): qty 2

## 2021-12-10 MED ORDER — PROMETHAZINE HCL 12.5 MG PO TABS
12.5000 mg | ORAL_TABLET | ORAL | Status: DC | PRN
  Filled 2021-12-10: qty 2

## 2021-12-10 MED ORDER — HYDROCODONE-ACETAMINOPHEN 5-325 MG PO TABS
1.0000 | ORAL_TABLET | ORAL | Status: DC | PRN
  Administered 2021-12-10 (×2): 1 via ORAL
  Filled 2021-12-10 (×2): qty 1

## 2021-12-10 MED ORDER — LABETALOL HCL 5 MG/ML IV SOLN: 10.0000 mg | INTRAVENOUS | Status: DC | PRN

## 2021-12-10 MED ORDER — LINAGLIPTIN 5 MG PO TABS
5.0000 mg | ORAL_TABLET | Freq: Every day | ORAL | Status: DC
  Administered 2021-12-11: 5 mg via ORAL
  Filled 2021-12-10: qty 1

## 2021-12-10 MED ORDER — OXYCODONE HCL 5 MG PO TABS
5.0000 mg | ORAL_TABLET | ORAL | Status: DC | PRN
  Administered 2021-12-11 (×3): 10 mg via ORAL
  Filled 2021-12-10 (×3): qty 2

## 2021-12-10 MED ORDER — HYDROCODONE-ACETAMINOPHEN 5-325 MG PO TABS
1.0000 | ORAL_TABLET | ORAL | Status: DC | PRN
  Administered 2021-12-10: 2 via ORAL
  Filled 2021-12-10: qty 2

## 2021-12-10 MED ORDER — LACTATED RINGERS IV SOLN: INTRAVENOUS | Status: DC

## 2021-12-10 MED ORDER — MIRABEGRON ER 25 MG PO TB24
25.0000 mg | ORAL_TABLET | Freq: Every morning | ORAL | Status: DC
  Administered 2021-12-10 – 2021-12-11 (×2): 25 mg via ORAL
  Filled 2021-12-10 (×2): qty 1

## 2021-12-10 MED ORDER — ONDANSETRON HCL 4 MG PO TABS: 4.0000 mg | ORAL_TABLET | ORAL | Status: DC | PRN

## 2021-12-10 MED ORDER — ONDANSETRON HCL 4 MG/2ML IJ SOLN
INTRAMUSCULAR | Status: DC | PRN
  Administered 2021-12-10: 4 mg via INTRAVENOUS

## 2021-12-10 MED ORDER — ENTACAPONE 200 MG PO TABS
200.0000 mg | ORAL_TABLET | ORAL | Status: DC
  Administered 2021-12-10 – 2021-12-11 (×4): 200 mg via ORAL
  Filled 2021-12-10 (×5): qty 1

## 2021-12-10 SURGICAL SUPPLY — 74 items
BAG COUNTER SPONGE SURGICOUNT (BAG) ×2 IMPLANT
BIT DRILL NEURO 2X3.1 SFT TUCH (MISCELLANEOUS) IMPLANT
BLADE CLIPPER SURG (BLADE) ×2 IMPLANT
BLADE SURG 11 STRL SS (BLADE) ×3 IMPLANT
BNDG ADH 1X3 SHEER STRL LF (GAUZE/BANDAGES/DRESSINGS) ×4 IMPLANT
BNDG GAUZE ELAST 4 BULKY (GAUZE/BANDAGES/DRESSINGS) ×2 IMPLANT
BOOT SUTURE AID YELLOW STND (SUTURE) IMPLANT
CABLE EXTENSION OR (CABLE) ×2
CABLE EXTENSION OR 8 CONTACT (CABLE) IMPLANT
CABLE LEAD 1.5 GUIDELINE 5.0 (CABLE) ×1 IMPLANT
CANISTER SUCT 3000ML PPV (MISCELLANEOUS) ×2 IMPLANT
CARTRIDGE OIL MAESTRO DRILL (MISCELLANEOUS) ×1 IMPLANT
CLIP RANEY DISP (INSTRUMENTS) IMPLANT
DECANTER SPIKE VIAL GLASS SM (MISCELLANEOUS) ×4 IMPLANT
DIFFUSER DRILL AIR PNEUMATIC (MISCELLANEOUS) ×1 IMPLANT
DRAPE STERI IOBAN 125X83 (DRAPES) IMPLANT
DRILL NEURO 2X3.1 SOFT TOUCH (MISCELLANEOUS)
DRSG OPSITE 4X5.5 SM (GAUZE/BANDAGES/DRESSINGS) ×3 IMPLANT
DRSG OPSITE POSTOP 3X4 (GAUZE/BANDAGES/DRESSINGS) ×1 IMPLANT
DRSG TEGADERM 2-3/8X2-3/4 SM (GAUZE/BANDAGES/DRESSINGS) ×2 IMPLANT
DRSG TELFA 3X8 NADH (GAUZE/BANDAGES/DRESSINGS) ×2 IMPLANT
DURAPREP 26ML APPLICATOR (WOUND CARE) ×2 IMPLANT
GAUZE 4X4 16PLY ~~LOC~~+RFID DBL (SPONGE) ×1 IMPLANT
GAUZE SPONGE 4X4 12PLY STRL (GAUZE/BANDAGES/DRESSINGS) IMPLANT
GLOVE EXAM NITRILE XL STR (GLOVE) IMPLANT
GLOVE SRG 8 PF TXTR STRL LF DI (GLOVE) ×2 IMPLANT
GLOVE SURG ENC MOIS LTX SZ8 (GLOVE) ×1 IMPLANT
GLOVE SURG LTX SZ8 (GLOVE) ×4 IMPLANT
GLOVE SURG UNDER POLY LF SZ8 (GLOVE) ×2
GLOVE SURG UNDER POLY LF SZ8.5 (GLOVE) ×1 IMPLANT
GOWN STRL REUS W/ TWL LRG LVL3 (GOWN DISPOSABLE) IMPLANT
GOWN STRL REUS W/ TWL XL LVL3 (GOWN DISPOSABLE) ×1 IMPLANT
GOWN STRL REUS W/TWL 2XL LVL3 (GOWN DISPOSABLE) ×1 IMPLANT
GOWN STRL REUS W/TWL LRG LVL3 (GOWN DISPOSABLE)
GOWN STRL REUS W/TWL XL LVL3 (GOWN DISPOSABLE) ×1
HEMOSTAT POWDER KIT SURGIFOAM (HEMOSTASIS) ×3 IMPLANT
KIT BASIN OR (CUSTOM PROCEDURE TRAY) ×2 IMPLANT
KIT COVER BURR HOLE (Miscellaneous) ×1 IMPLANT
KIT HEADREST PASSIVE (NEUROSURGERY SUPPLIES) ×2 IMPLANT
KIT NEUROSTIM DBS 45 8 CONT (Miscellaneous) IMPLANT
KIT NEUROSTIM DBS W/BURR COVER (Miscellaneous) IMPLANT
KIT PLATFORM UNI 4LEG STARFIX (KITS) ×1 IMPLANT
KIT SUTURE REMOVAL HAMOT (SET/KITS/TRAYS/PACK) ×2 IMPLANT
KIT TURNOVER KIT B (KITS) ×2 IMPLANT
LEAD KIT DBS DIRECTIONAL 45C (Miscellaneous) ×1 IMPLANT
MARKER SKIN DUAL TIP RULER LAB (MISCELLANEOUS) ×4 IMPLANT
NDL HYPO 25X1 1.5 SAFETY (NEEDLE) ×2 IMPLANT
NDL SPNL 18GX3.5 QUINCKE PK (NEEDLE) IMPLANT
NDL SPNL 22GX3.5 QUINCKE BK (NEEDLE) IMPLANT
NEEDLE HYPO 25X1 1.5 SAFETY (NEEDLE) ×2 IMPLANT
NEEDLE SPNL 18GX3.5 QUINCKE PK (NEEDLE) IMPLANT
NEEDLE SPNL 22GX3.5 QUINCKE BK (NEEDLE) IMPLANT
NS IRRIG 1000ML POUR BTL (IV SOLUTION) ×2 IMPLANT
OIL CARTRIDGE MAESTRO DRILL (MISCELLANEOUS)
PACK LAMINECTOMY NEURO (CUSTOM PROCEDURE TRAY) ×2 IMPLANT
PAD ARMBOARD 7.5X6 YLW CONV (MISCELLANEOUS) ×4 IMPLANT
PAD DRESSING TELFA 3X8 NADH (GAUZE/BANDAGES/DRESSINGS) ×1 IMPLANT
PERFORATOR LRG  14-11MM (BIT) ×1
PERFORATOR LRG 14-11MM (BIT) ×1 IMPLANT
PLATEFORM ARRAY DZAP LEADPOINT (MISCELLANEOUS) ×1
PLATFORM ARRAY DZAP LEADPOINT (MISCELLANEOUS) IMPLANT
PLATFORM BILATERAL KIT (MISCELLANEOUS) IMPLANT
SET SINGLE IT STRL (KITS) ×1 IMPLANT
SPONGE SURGIFOAM ABS GEL SZ50 (HEMOSTASIS) ×1 IMPLANT
STAPLER SKIN PROX WIDE 3.9 (STAPLE) ×2 IMPLANT
SUT ETHILON 3 0 PS 1 (SUTURE) ×1 IMPLANT
SUT SILK 2 0 TIES 10X30 (SUTURE) ×2 IMPLANT
SUT VIC AB 2-0 CP2 18 (SUTURE) ×3 IMPLANT
SUT VIC AB 3-0 SH 8-18 (SUTURE) ×1 IMPLANT
SUT VICRYL RAPIDE 4/0 PS 2 (SUTURE) ×1 IMPLANT
TOWEL GREEN STERILE (TOWEL DISPOSABLE) ×2 IMPLANT
TOWEL GREEN STERILE FF (TOWEL DISPOSABLE) ×2 IMPLANT
TRAY FOLEY MTR SLVR 16FR STAT (SET/KITS/TRAYS/PACK) IMPLANT
WATER STERILE IRR 1000ML POUR (IV SOLUTION) ×2 IMPLANT

## 2021-12-10 NOTE — Op Note (Signed)
Providing Compassionate, Quality Care - Together  Date of service: 12/10/2021  PREOP DIAGNOSIS:  Parkinson disease with right upper extremity tremor  POSTOP DIAGNOSIS: Same  PROCEDURE: Left Trudee Kuster hole for Stereotactic placement of left STN DBS lead for Parkinson's disease [facility] [employer] 45 cm 8 contact DBS directional lead kit) Intraoperative use of stereotaxy, star fix system Intraoperative use of monitoring, with microelectrode recording  SURGEON: Dr. Pieter Partridge C. Zaya Kessenich, DO  ASSISTANT: Dr. Wells Guiles Tat, DO  ANESTHESIA: General Endotracheal  EBL: Less than 25 cc  SPECIMENS: None  DRAINS: None  COMPLICATIONS: None  CONDITION: Hemodynamically stable  HISTORY: Maxwell Tate is a 66 y.o. male with a history of Parkinson disease and severe right upper extremity tremor is intractable to medication.  He was referred by Dr. Carles Collet for left STN DBS lead placement.  He underwent fiducial placement with CT planning without difficulty.  He presents today for left STN DBS placement.  We discussed all risks, benefits and expected outcomes including but not limited to hemorrhage, stroke, death, infection, need for revision surgery, weakness that is temporary or permanent.  Informed consent was obtained.  PROCEDURE IN DETAIL: The patient was brought to the operating room.  Patient was placed on the operating table and his neck was fixed with the [place] neck brace.  All pressure points were meticulously padded. Skin incisions was then marked out and prepped and draped in the usual sterile fashion.  Physician driven timeout was performed.  Local anesthetic was injected into the previous for fiducial incisions as well as a planned bur hole site.  Using Metzenbaum scissors, the previous fiducial incisions were opened sharply.  The stereotactic star fix system was then attached to all 4 fiducials with appropriate purchase.  A curvilinear incision was then planned around the bur hole site for our  planned trajectory over the left frontal region.  Local anesthetic was injected in this planned incision as well as along the tract of the tunneling site on the posterior left parietal region.  Using a 10 blade, a curvilinear incision was made sharply down to the pericranium over the left frontal region.  Bovie electrocautery was used to reflect a the flap laterally.  A 2-0 Vicryl suture was used for retraction laterally.  The Star fix stereotactic frame was placed again and the bur hole site was marked.  Using a high-speed drill, bur hole was performed.  Epidural hemostasis was achieved with bipolar cautery and Surgifoam.  The dura was then anesthetized with Marcaine soaked cottonoids.  Using a 11 blade, a cruciate durotomy was performed.  The dura was coagulated and reflected appropriately.  Surgifoam was used to then seal the intradural space to minimize CSF loss.  The Star fix frame was then placed again in the appropriate trajectory corticectomy was planned.  Using bipolar cautery and 11 blade a corticectomy was performed and hemostasis was achieved with Surgicel and bipolar cautery.  The star fix frame was placed in the microelectrode device was attached.  The probe was then gently inserted into the corticectomy site down to the planned trajectory.  Microelectrode was placed.  We then began recording, please see Dr. Carles Collet dictation for the microelectrode recording.  We had excellent response of tremor cessation without side effects and excellent recording of the STN.  Macro stimulation was performed without difficulty with appropriate response.  The micro electrode was then removed.  The selected target was then measured on the DBS lead.  This was gently placed without difficulty.  This was  stimulated and the appropriate response and cessation of tremor without side effect.  The reduction tower was moved and the lead was secured in place with the trapdoor.  The microelectrode and frame was then gently  removed and the lead remained in place appropriately.  The epidural space and intradural space was excellently hemostatic.  The lead was then secured, final Placed over the bur hole, protection boot was placed and tunneled laterally to the left parietal region with large forceps.  Hemostasis was achieved with bipolar cautery and Surgifoam.  The wound was excellently hemostatic.  The galea was closed with 2-0 Vicryl sutures.  Skin was closed with staples.  The fiducials were removed, 4 incisions were closed with 3-0 Vicryl sutures and 4-0 Rapide sutures on the anterior incisions.  The posterior incisions was closed with staples.  Sterile dressing applied.  At the end of the case all sponge, needle, and instrument counts were correct. The patient was then transferred to the stretcher, and taken to the post-anesthesia care unit in stable hemodynamic condition.  He tolerated the surgery very well.

## 2021-12-10 NOTE — Op Note (Signed)
Preoperative diagnosis:  Parkinsons Disease  Postoperative diagnosis:  same Surgeon:  Kendell Bane Dawley Neurologist:  Lurena Joiner Weber Monnier  Indication for procedure: Determination of optimal electrode position for deep brain stimulation therapy. Complications: None   Description of procedure:  Following the incision and burr hole placement, a recording microelectrode was slowly advanced into the brain via a motorized drive.  Beginning approximately 10 mm above the target, recordings were periodically made, and the resultant brain activity was described on the neurophysiologic recording worksheet.  Relevant samples of the recordings were saved for subsequent off line analysis.  A total of one recording pass was obtained on the left side of the brain.    6.5 mm of STN  were recorded from the left side of the brain. The sensory-motor subregion of the STN was identified. Following the recording, stimulation through the microelectrode was performed.  This demonstrated improvement in tremor, rigidity, bradykinesia.  The recording electrode was then replaced by a stimulating electrode by Dr. Jake Samples.  Test stimulations were again obtained.  Active contacts that were used and the response to stimulation, including both adverse effects and therapeutic effects, were recorded on the neurophysiologic stimulation worksheet.  In brief, there was complete resolution of tremor, rigidity, and bradykinesia at therapeutic voltages.  Because of more persistent, albeit mild, paresthesias in the fingertips on the right side, the electrode was slightly pulled back 1 mm.   Kerin Salen, DO [facility] Director of Movement Disorders

## 2021-12-10 NOTE — H&P (Signed)
Providing Compassionate, Quality Care - Together  NEUROSURGERY HISTORY & PHYSICAL   Maxwell Tate is an 66 y.o. male.   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  He underwent fiducial placement last week with no complications.  He has no complaints at this time.  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   COVID 2021   cough only   History of kidney stones    Mixed hyperlipidemia    Neck pain 05/22/2020   Parkinson's disease    Personal history of colonic polyps    Primary hypothyroidism    Spinal stenosis, cervical region 05/22/2020   Type 2 diabetes mellitus without complication    Vitamin D deficiency     Past Surgical History:  Procedure Laterality Date   CARPAL TUNNEL RELEASE  2000   two weeks apart   HEMORRHOID SURGERY  2015   HEMORRHOID SURGERY  2005   KNEE SURGERY  2006   MINOR PLACEMENT OF FIDUCIAL N/A 12/01/2021   Procedure: Fiducial placement;  Surgeon: Bethann Goo, DO;  [facility];  Service: Neurosurgery;  Laterality: N/A;  MINOR ROOM   RADIOLOGY WITH ANESTHESIA N/A 10/29/2021   Procedure: MRI BRAIN WITH AND WITHOUT CONTRAST WITH ANESTHESIA;  Surgeon: Radiologist, Medication, MD;  Location: [facility] OR;  Service: Radiology;  Laterality: N/A;   REPLACEMENT TOTAL KNEE  2021   RHINOPLASTY  1996   ROTATOR CUFF REPAIR  2006   ROTATOR CUFF REPAIR  2009   TESTICLE REMOVAL  2012    Family History  Problem Relation Age of Onset   Breast cancer Mother 29   Diabetic kidney disease Mother 41   Heart disease Father    Dementia Father        onset during mid to late 34s   Diabetes Sister    Lymphoma Brother    Parkinson's disease Maternal Grandfather    Crohn's disease Son    Social History:  reports that he has never smoked.  He has never used smokeless tobacco. He reports that he does not currently use alcohol. He reports that he does not use drugs.  Allergies: No Known Allergies  Medications Prior to Admission  Medication Sig Dispense Refill   atorvastatin (LIPITOR) 40 MG tablet Take 40 mg by mouth in the morning.     cholecalciferol (VITAMIN D3) 25 MCG (1000 UNIT) tablet Take 2,000 Units by mouth in the morning.     ciclopirox (PENLAC) 8 % solution Apply 1 application topically in the morning.     diclofenac (VOLTAREN) 75 MG EC tablet Take 75 mg by mouth in the morning.     levothyroxine (SYNTHROID) 88 MCG tablet Take 88 mcg by mouth in the morning.     Multiple Vitamin (MULTIVITAMIN WITH MINERALS) TABS tablet Take 2 tablets by mouth daily.     Multiple Vitamins-Minerals (OCUVITE PRESERVISION PO) Take 1 tablet by mouth in the morning and at bedtime.     MYRBETRIQ 25 MG TB24 tablet Take 25 mg by mouth in the morning.     sildenafil (VIAGRA) 100 MG tablet Take 100 mg by mouth daily as needed for erectile dysfunction.     sitaGLIPtin-metformin (JANUMET) 50-1000 MG tablet Take 1 tablet by mouth in the morning.     Carbidopa-Levodopa ER (  SINEMET CR) 25-100 MG tablet controlled release TAKE 2 TABS AT 7AM/ 11AM AND 4PM (Patient taking differently: Take 2 tabs at 7am/ 12pm and bedtime) 540 tablet 0   diazepam (VALIUM) 5 MG tablet 1 tab 1 hour prior to procedure; may repeat 15-30 min prior to procedure (Patient not taking: Reported on 10/22/2021) 3 tablet 0   entacapone (COMTAN) 200 MG tablet 1 TABLET AT 7AM/11AM/4PM WITH LEVODOPA DOSE (Patient taking differently: Take 1 tablet at 7am/ 12pm and bedtime) 270 tablet 0   Pramipexole Dihydrochloride 1.5 MG TB24 TAKE 1 TABLET BY MOUTH ONCE DAILY (Patient not taking: Reported on 11/24/2021) 30 tablet 3    Results for orders placed or performed during the hospital encounter of 12/10/21 (from the past 48 hour(s))  Glucose, capillary     Status: Abnormal   Collection Time:  12/10/21  6:17 AM  Result Value Ref Range   Glucose-Capillary 150 (H) 70 - 99 mg/dL    Comment: Glucose reference range applies only to samples taken after fasting for at least 8 hours.  CBC per protocol     Status: None   Collection Time: 12/10/21  6:55 AM  Result Value Ref Range   WBC 5.1 4.0 - 10.5 K/uL   RBC 4.92 4.22 - 5.81 MIL/uL   Hemoglobin 15.1 13.0 - 17.0 g/dL   HCT 01.7 49.4 - 49.6 %   MCV 91.9 80.0 - 100.0 fL   MCH 30.7 26.0 - 34.0 pg   MCHC 33.4 30.0 - 36.0 g/dL   RDW 75.9 16.3 - 84.6 %   Platelets 263 150 - 400 K/uL   nRBC 0.0 0.0 - 0.2 %    Comment: Performed at [facility], [address] [place], [place] [address]   No results found.  ROS All positive and negative listed in HPI above  Blood pressure 131/73, pulse (!) 57, temperature 98.6 F (37 C), temperature source Oral, resp. rate 18, height 5\' 9"  (1.753 m), weight 90.7 kg, SpO2 94 %. Physical Exam  Awake alert Orient x3, no acute distress PERRLA Cranial nerves II through XII intact Bilateral upper extremity/lower extremity 5/5 Right upper extremity tremor Incisions well-healed from fiducials  Assessment/Plan 66 year old male with  Parkinson disease with right upper extremity tremor  -OR today for left STN DBS placement.  We discussed all risks, benefits expected outcomes, as well as alternatives of treatment.  The patient like to proceed, informed consent was obtained.    Thank you for allowing me to participate in this patient's care.  Please do not hesitate to call with questions or concerns.   76, DO Neurosurgeon [facility] Cell: [phone]

## 2021-12-10 NOTE — Addendum Note (Signed)
Addendum  created 12/10/21 1621 by Lynnell Chad, CRNA   Intraprocedure Event edited

## 2021-12-10 NOTE — Anesthesia Postprocedure Evaluation (Signed)
Anesthesia Post Note  Patient: Maxwell Tate  Procedure(s) Performed: Deep brain stimulator placement (Left: Head)     Patient location during evaluation: PACU Anesthesia Type: MAC Level of consciousness: awake and alert Pain management: pain level controlled Vital Signs Assessment: post-procedure vital signs reviewed and stable Respiratory status: spontaneous breathing, nonlabored ventilation and respiratory function stable Cardiovascular status: stable and blood pressure returned to baseline Postop Assessment: no apparent nausea or vomiting Anesthetic complications: no   No notable events documented.  Last Vitals:  Vitals:   12/10/21 1141 12/10/21 1156  BP: 111/80 110/73  Pulse: (!) 56 (!) 52  Resp: 15 17  Temp:  36.6 C  SpO2: 92% 93%    Last Pain:  Vitals:   12/10/21 1156  TempSrc:   PainSc: 0-No pain                 Lorraina Spring,W. EDMOND

## 2021-12-10 NOTE — Transfer of Care (Signed)
Immediate Anesthesia Transfer of Care Note  Patient: Maxwell Tate  Procedure(s) Performed: Deep brain stimulator placement (Left: Head)  Patient Location: PACU  Anesthesia Type:MAC  Level of Consciousness: awake  Airway & Oxygen Therapy: Patient Spontanous Breathing  Post-op Assessment: Report given to RN and Post -op Vital signs reviewed and stable  Post vital signs: Reviewed and stable  Last Vitals:  Vitals Value Taken Time  BP 124/78 12/10/21 1126  Temp 36.6 C 12/10/21 1126  Pulse 56 12/10/21 1128  Resp 14 12/10/21 1128  SpO2 97 % 12/10/21 1128  Vitals shown include unvalidated device data.  Last Pain:  Vitals:   12/10/21 0648  TempSrc:   PainSc: 0-No pain         Complications: No notable events documented.

## 2021-12-10 NOTE — Progress Notes (Signed)
° °  Providing Compassionate, Quality Care - Together  NEUROSURGERY PROGRESS NOTE   S: pt s/e in recovery, doing well, no complaints  O: EXAM:  BP 124/78 (BP Location: Left Arm)    Pulse 60    Temp 97.8 F (36.6 C)    Resp 12    Ht 5\' 9"  (1.753 m)    Wt 90.7 kg    SpO2 97%    BMI 29.53 kg/m   Awake, alert, oriented x3 PERRL Face symmetric Speech fluent, appropriate  CNs grossly intact  5/5 BUE/BLE  Incisions/dressings clean dry and intact. Right upper extremity tremor  ASSESSMENT:  66 y.o. male with   Parkinson's disease with right upper extremity tremor  -Status post left STN DBS lead placement (stage I)  PLAN: -Patient is doing well.  CT brain DBS protocol pending -Family updated -Floor status is CT stable    Thank you for allowing me to participate in this patient's care.  Please do not hesitate to call with questions or concerns.   76, DO Neurosurgeon [facility] Cell: [phone]

## 2021-12-11 ENCOUNTER — Encounter (HOSPITAL_COMMUNITY): Payer: Self-pay | Admitting: Neurological Surgery

## 2021-12-11 LAB — GLUCOSE, CAPILLARY
Glucose-Capillary: 131 mg/dL — ABNORMAL HIGH (ref 70–99)
Glucose-Capillary: 126 mg/dL — ABNORMAL HIGH (ref 70–99)

## 2021-12-11 MED ORDER — HYDROCODONE-ACETAMINOPHEN 5-325 MG PO TABS: 1.0000 | ORAL_TABLET | Freq: Four times a day (QID) | ORAL | 0 refills | Status: AC | PRN

## 2021-12-11 NOTE — Discharge Summary (Signed)
Physician Discharge Summary  Patient ID: Maxwell Tate MRN: [id number] DOB/AGE: [birth year] 66 y.o.  Admit date: 12/10/2021 Discharge date: 12/11/2021  Admission Diagnoses:parkinson's disease  Discharge Diagnoses: same Principal Problem:   Parkinson disease [employer])   Discharged Condition: good  Hospital Course: Mr. Maxwell Tate was admitted and taken to the operating room for a deep brain stimulator placement. This was successful and postoperatively he has done well. His wounds are clean, dry, and without signs of infection. He is ambulating, voiding, and tolerating a regular diet Post op head CT showed stimulator in good position.  Treatments: surgery: deep brain stimulation  Placement left Discharge Exam: Blood pressure 129/90, pulse (!) 58, temperature 98.4 F (36.9 C), temperature source Oral, resp. rate 18, height 5\' 9"  (1.753 m), weight 90.7 kg, SpO2 99 %. General appearance: alert, cooperative, appears stated age, and no distress  Disposition: Discharge disposition: 01-Home or Self Care      Parkinsons Disease  Allergies as of 12/11/2021   No Known Allergies      Medication List     STOP taking these medications    diazepam 5 MG tablet Commonly known as: Valium   Pramipexole Dihydrochloride 1.5 MG Tb24       TAKE these medications    atorvastatin 40 MG tablet Commonly known as: LIPITOR Take 40 mg by mouth in the morning.   Carbidopa-Levodopa ER 25-100 MG tablet controlled release Commonly known as: SINEMET CR TAKE 2 TABS AT 7AM/ 11AM AND 4PM What changed: See the new instructions.   cholecalciferol 25 MCG (1000 UNIT) tablet Commonly known as: VITAMIN D3 Take 2,000 Units by mouth in the morning.   ciclopirox 8 % solution Commonly known as: PENLAC Apply 1 application topically in the morning.   diclofenac 75 MG EC tablet Commonly known as: VOLTAREN Take 75 mg by mouth in the morning.   entacapone 200 MG tablet Commonly known as: COMTAN 1 TABLET AT  7AM/11AM/4PM WITH LEVODOPA DOSE What changed: See the new instructions.   HYDROcodone-acetaminophen 5-325 MG tablet Commonly known as: NORCO/VICODIN Take 1-2 tablets by mouth every 6 (six) hours as needed for up to 7 days for moderate pain.   levothyroxine 88 MCG tablet Commonly known as: SYNTHROID Take 88 mcg by mouth in the morning.   multivitamin with minerals Tabs tablet Take 2 tablets by mouth daily.   Myrbetriq 25 MG Tb24 tablet Generic drug: mirabegron ER Take 25 mg by mouth in the morning.   OCUVITE PRESERVISION PO Take 1 tablet by mouth in the morning and at bedtime.   sildenafil 100 MG tablet Commonly known as: VIAGRA Take 100 mg by mouth daily as needed for erectile dysfunction.   sitaGLIPtin-metformin 50-1000 MG tablet Commonly known as: JANUMET Take 1 tablet by mouth in the morning.        Follow-up Information     Dawley, Troy C, DO Follow up in 3 month(s).   Why: keep your scheduled appoinment on 01/04/22 Contact information: [address] [place] [address] [place] [phone]                 Signed: [phone] 12/11/2021, 1:49 PM

## 2021-12-11 NOTE — Progress Notes (Signed)
Patient awaiting transport via wheelchair by volunteer for discharge home; in no acute distress nor complaints of pain nor discomfort; incisions on his head with staples on and are clean, dry and intact; room was checked and accounted for all his belongings; discharge instructions concerning his medications, incision care, follow up appointment and when to call the doctor as needed were all discussed with patient and his wife by RN and both expressed understanding on the instructions given.

## 2021-12-11 NOTE — Plan of Care (Signed)
Problem: Education: ?Goal: Required Educational Video(s) ?Outcome: Completed/Met ?  ?Problem: Clinical Measurements: ?Goal: Postoperative complications will be avoided or minimized ?Outcome: Completed/Met ?  ?Problem: Skin Integrity: ?Goal: Demonstration of wound healing without infection will improve ?Outcome: Completed/Met ?  ?

## 2021-12-14 MED FILL — Thrombin For Soln 5000 Unit: CUTANEOUS | Qty: 5000 | Status: AC

## 2021-12-15 ENCOUNTER — Other Ambulatory Visit: Payer: Self-pay

## 2021-12-15 ENCOUNTER — Encounter (HOSPITAL_COMMUNITY): Payer: Self-pay | Admitting: Neurological Surgery

## 2021-12-15 NOTE — Progress Notes (Signed)
PCP - Blanchard Kelch, FNP Cardiologist - denies EKG - 10/29/21 Chest x-ray -  ECHO -  Cardiac Cath -   COVID TEST- n/a  Anesthesia review: n/a  -------------  SDW INSTRUCTIONS:  Your procedure is scheduled on Thursday 2/9. Please report to [facility] Main Entrance "A" at 0920 A.M., and check in at the Admitting office. Call this number if you have problems the morning of surgery: [phone]   Remember: Do not eat after midnight the night before your surgery  You may drink clear liquids until 0850 AM the morning of your surgery.   Clear liquids allowed are: Water, Non-Citrus Juices (without pulp), Carbonated Beverages, Clear Tea, Black Coffee Only, and Gatorade (do not add anything to drinks)   Medications to take morning of surgery with a sip of water include: atorvastatin (LIPITOR)  Carbidopa-Levodopa ER (SINEMET CR)  entacapone (COMTAN) HYDROcodone-acetaminophen (NORCO/VICODIN) if needed levothyroxine (SYNTHROID)  MYRBETRIQ   ** PLEASE check your blood sugar the morning of your surgery when you wake up and every 2 hours until you get to the Short Stay unit.  If your blood sugar is less than 70 mg/dL, you will need to treat for low blood sugar: Do not take insulin. Treat a low blood sugar (less than 70 mg/dL) with  cup of clear juice (cranberry or apple), 4 glucose tablets, OR glucose gel. Recheck blood sugar in 15 minutes after treatment (to make sure it is greater than 70 mg/dL). If your blood sugar is not greater than 70 mg/dL on recheck, call [phone] for further instructions.  sitaGLIPtin-metformin (JANUMET)  None day before surgery  None day of surgery   As of today, STOP taking any Aspirin (unless otherwise instructed by your surgeon), Aleve, Naproxen, Ibuprofen, Motrin, Advil, Goody's, BC's, all herbal medications, fish oil, and all vitamins.    The Morning of Surgery Do not wear jewelry Do not wear lotions, powders, colognes, or deodorant Do not bring  valuables to the hospital. [facility] is not responsible for any belongings or valuables.  If you are a smoker, DO NOT Smoke 24 hours prior to surgery  If you wear a CPAP at night please bring your mask the morning of surgery   Remember that you must have someone to transport you home after your surgery, and remain with you for 24 hours if you are discharged the same day.  Please bring cases for contacts, glasses, hearing aids, dentures or bridgework because it cannot be worn into surgery.   Patients discharged the day of surgery will not be allowed to drive home.   Please shower the NIGHT BEFORE/MORNING OF SURGERY (use antibacterial soap like DIAL soap if possible). Wear comfortable clothes the morning of surgery. Oral Hygiene is also important to reduce your risk of infection.  Remember - BRUSH YOUR TEETH THE MORNING OF SURGERY WITH YOUR REGULAR TOOTHPASTE  Patient denies shortness of breath, fever, cough and chest pain.

## 2021-12-17 ENCOUNTER — Encounter (HOSPITAL_COMMUNITY)
Admission: RE | Disposition: A | Discharge status: Home / Self Care | Payer: Self-pay | Source: Ambulatory Visit | Attending: Neurological Surgery

## 2021-12-17 ENCOUNTER — Other Ambulatory Visit: Payer: Self-pay

## 2021-12-17 ENCOUNTER — Ambulatory Visit (HOSPITAL_BASED_OUTPATIENT_CLINIC_OR_DEPARTMENT_OTHER): Payer: Medicare Other | Admitting: Anesthesiology

## 2021-12-17 ENCOUNTER — Ambulatory Visit (HOSPITAL_COMMUNITY): Payer: Medicare Other | Admitting: Anesthesiology

## 2021-12-17 ENCOUNTER — Ambulatory Visit (HOSPITAL_COMMUNITY)
Admission: RE | Admit: 2021-12-17 | Discharge: 2021-12-17 | Disposition: A | Discharge status: Home / Self Care | Payer: Medicare Other | Source: Ambulatory Visit | Attending: Neurological Surgery | Admitting: Neurological Surgery

## 2021-12-17 ENCOUNTER — Encounter (HOSPITAL_COMMUNITY): Payer: Self-pay | Admitting: Neurological Surgery

## 2021-12-17 DIAGNOSIS — E039 Hypothyroidism, unspecified: Secondary | ICD-10-CM | POA: Diagnosis not present

## 2021-12-17 DIAGNOSIS — E119 Type 2 diabetes mellitus without complications: Secondary | ICD-10-CM

## 2021-12-17 DIAGNOSIS — G20 Parkinson's disease: Secondary | ICD-10-CM | POA: Insufficient documentation

## 2021-12-17 DIAGNOSIS — F419 Anxiety disorder, unspecified: Secondary | ICD-10-CM | POA: Diagnosis not present

## 2021-12-17 HISTORY — PX: PULSE GENERATOR IMPLANT: SHX5370

## 2021-12-17 LAB — GLUCOSE, CAPILLARY
Glucose-Capillary: 135 mg/dL — ABNORMAL HIGH (ref 70–99)
Glucose-Capillary: 137 mg/dL — ABNORMAL HIGH (ref 70–99)
Glucose-Capillary: 123 mg/dL — ABNORMAL HIGH (ref 70–99)
Glucose-Capillary: 133 mg/dL — ABNORMAL HIGH (ref 70–99)

## 2021-12-17 SURGERY — UNILATERAL PULSE GENERATOR IMPLANT
Anesthesia: General | Laterality: Left

## 2021-12-17 MED ORDER — INSULIN ASPART 100 UNIT/ML IJ SOLN: 0.0000 [IU] | INTRAMUSCULAR | Status: DC | PRN

## 2021-12-17 MED ORDER — LIDOCAINE-EPINEPHRINE 1 %-1:100000 IJ SOLN
INTRAMUSCULAR | Status: AC
  Filled 2021-12-17: qty 1

## 2021-12-17 MED ORDER — ONDANSETRON HCL 4 MG/2ML IJ SOLN: 4.0000 mg | Freq: Once | INTRAMUSCULAR | Status: DC | PRN

## 2021-12-17 MED ORDER — DEXAMETHASONE SODIUM PHOSPHATE 10 MG/ML IJ SOLN
INTRAMUSCULAR | Status: DC | PRN
  Administered 2021-12-17: 4 mg via INTRAVENOUS

## 2021-12-17 MED ORDER — MIDAZOLAM HCL 2 MG/2ML IJ SOLN
INTRAMUSCULAR | Status: AC
  Filled 2021-12-17: qty 2

## 2021-12-17 MED ORDER — LIDOCAINE 2% (20 MG/ML) 5 ML SYRINGE
INTRAMUSCULAR | Status: AC
  Filled 2021-12-17: qty 5

## 2021-12-17 MED ORDER — ORAL CARE MOUTH RINSE: 15.0000 mL | Freq: Once | OROMUCOSAL | Status: AC

## 2021-12-17 MED ORDER — PROPOFOL 10 MG/ML IV BOLUS
INTRAVENOUS | Status: AC
  Filled 2021-12-17: qty 20

## 2021-12-17 MED ORDER — ACETAMINOPHEN 500 MG PO TABS
1000.0000 mg | ORAL_TABLET | Freq: Once | ORAL | Status: AC
  Administered 2021-12-17: 1000 mg via ORAL
  Filled 2021-12-17: qty 2

## 2021-12-17 MED ORDER — DEXAMETHASONE SODIUM PHOSPHATE 10 MG/ML IJ SOLN
INTRAMUSCULAR | Status: AC
  Filled 2021-12-17: qty 1

## 2021-12-17 MED ORDER — LIDOCAINE HCL 1 % IJ SOLN
INTRAMUSCULAR | Status: DC | PRN
  Administered 2021-12-17: 10 mL via INTRADERMAL

## 2021-12-17 MED ORDER — CEFAZOLIN SODIUM-DEXTROSE 2-3 GM-%(50ML) IV SOLR
INTRAVENOUS | Status: DC | PRN
  Administered 2021-12-17: 2 g via INTRAVENOUS

## 2021-12-17 MED ORDER — BUPIVACAINE-EPINEPHRINE 0.5% -1:200000 IJ SOLN
INTRAMUSCULAR | Status: AC
  Filled 2021-12-17: qty 1

## 2021-12-17 MED ORDER — LACTATED RINGERS IV SOLN: INTRAVENOUS | Status: DC

## 2021-12-17 MED ORDER — FENTANYL CITRATE (PF) 100 MCG/2ML IJ SOLN: 25.0000 ug | INTRAMUSCULAR | Status: DC | PRN

## 2021-12-17 MED ORDER — CEFAZOLIN SODIUM 1 G IJ SOLR
INTRAMUSCULAR | Status: AC
  Filled 2021-12-17: qty 20

## 2021-12-17 MED ORDER — ROCURONIUM BROMIDE 10 MG/ML (PF) SYRINGE
PREFILLED_SYRINGE | INTRAVENOUS | Status: AC
  Filled 2021-12-17: qty 10

## 2021-12-17 MED ORDER — EPHEDRINE SULFATE-NACL 50-0.9 MG/10ML-% IV SOSY
PREFILLED_SYRINGE | INTRAVENOUS | Status: DC | PRN
  Administered 2021-12-17 (×2): 10 mg via INTRAVENOUS
  Administered 2021-12-17: 5 mg via INTRAVENOUS

## 2021-12-17 MED ORDER — EPHEDRINE 5 MG/ML INJ
INTRAVENOUS | Status: AC
  Filled 2021-12-17: qty 5

## 2021-12-17 MED ORDER — MIDAZOLAM HCL 5 MG/5ML IJ SOLN
INTRAMUSCULAR | Status: DC | PRN
  Administered 2021-12-17 (×2): 1 mg via INTRAVENOUS

## 2021-12-17 MED ORDER — CHLORHEXIDINE GLUCONATE 0.12 % MT SOLN
15.0000 mL | Freq: Once | OROMUCOSAL | Status: AC
  Administered 2021-12-17: 15 mL via OROMUCOSAL
  Filled 2021-12-17: qty 15

## 2021-12-17 MED ORDER — LIDOCAINE 2% (20 MG/ML) 5 ML SYRINGE
INTRAMUSCULAR | Status: DC | PRN
  Administered 2021-12-17: 80 mg via INTRAVENOUS

## 2021-12-17 MED ORDER — VANCOMYCIN HCL 1000 MG IV SOLR
INTRAVENOUS | Status: DC | PRN
  Administered 2021-12-17: 1000 mg via TOPICAL

## 2021-12-17 MED ORDER — FENTANYL CITRATE (PF) 100 MCG/2ML IJ SOLN
INTRAMUSCULAR | Status: DC | PRN
Start: 2021-12-17 — End: 2021-12-17
  Administered 2021-12-17 (×4): 25 ug via INTRAVENOUS

## 2021-12-17 MED ORDER — ONDANSETRON HCL 4 MG/2ML IJ SOLN
INTRAMUSCULAR | Status: DC | PRN
  Administered 2021-12-17 (×2): 4 mg via INTRAVENOUS

## 2021-12-17 MED ORDER — VANCOMYCIN HCL 1000 MG IV SOLR
INTRAVENOUS | Status: AC
  Filled 2021-12-17: qty 20

## 2021-12-17 MED ORDER — 0.9 % SODIUM CHLORIDE (POUR BTL) OPTIME
TOPICAL | Status: DC | PRN
  Administered 2021-12-17: 1000 mL

## 2021-12-17 MED ORDER — FENTANYL CITRATE (PF) 250 MCG/5ML IJ SOLN
INTRAMUSCULAR | Status: AC
  Filled 2021-12-17: qty 5

## 2021-12-17 MED ORDER — PROPOFOL 10 MG/ML IV BOLUS
INTRAVENOUS | Status: DC | PRN
  Administered 2021-12-17: 100 mg via INTRAVENOUS
  Administered 2021-12-17: 30 mg via INTRAVENOUS
  Administered 2021-12-17: 80 mg via INTRAVENOUS

## 2021-12-17 MED ORDER — ONDANSETRON HCL 4 MG/2ML IJ SOLN
INTRAMUSCULAR | Status: AC
  Filled 2021-12-17: qty 2

## 2021-12-17 MED ORDER — BACITRACIN ZINC 500 UNIT/GM EX OINT
TOPICAL_OINTMENT | CUTANEOUS | Status: AC
  Filled 2021-12-17: qty 28.35

## 2021-12-17 SURGICAL SUPPLY — 34 items
BAG COUNTER SPONGE SURGICOUNT (BAG) ×2 IMPLANT
BLADE CLIPPER SURG (BLADE) ×1 IMPLANT
CANISTER SUCT 3000ML PPV (MISCELLANEOUS) ×2 IMPLANT
CHARGING SYSTEM VERCISE (MISCELLANEOUS) ×2
DERMABOND ADVANCED (GAUZE/BANDAGES/DRESSINGS) ×1
DERMABOND ADVANCED .7 DNX12 (GAUZE/BANDAGES/DRESSINGS) ×1 IMPLANT
DRAPE ORTHO SPLIT 77X108 STRL (DRAPES) ×2
DRAPE SURG ORHT 6 SPLT 77X108 (DRAPES) ×2 IMPLANT
DRSG OPSITE POSTOP 3X4 (GAUZE/BANDAGES/DRESSINGS) ×1 IMPLANT
DRSG OPSITE POSTOP 4X6 (GAUZE/BANDAGES/DRESSINGS) ×1 IMPLANT
DRSG TELFA 3X8 NADH (GAUZE/BANDAGES/DRESSINGS) IMPLANT
DURAPREP 26ML APPLICATOR (WOUND CARE) ×2 IMPLANT
GAUZE 4X4 16PLY ~~LOC~~+RFID DBL (SPONGE) ×1 IMPLANT
GAUZE SPONGE 4X4 12PLY STRL (GAUZE/BANDAGES/DRESSINGS) IMPLANT
GLOVE SRG 8 PF TXTR STRL LF DI (GLOVE) ×1 IMPLANT
GLOVE SURG LTX SZ8 (GLOVE) ×4 IMPLANT
GLOVE SURG UNDER POLY LF SZ8 (GLOVE) ×2
KIT BASIN OR (CUSTOM PROCEDURE TRAY) ×2 IMPLANT
KIT CONTACT EXTENSION 55CMX8 (Miscellaneous) ×1 IMPLANT
KIT GENUS R16 GENERATOR (Generator) ×1 IMPLANT
KIT REMOTE CONTROL 4 VERCISE (KITS) ×1 IMPLANT
MARKER SKIN DUAL TIP RULER LAB (MISCELLANEOUS) ×2 IMPLANT
NDL HYPO 25X1 1.5 SAFETY (NEEDLE) ×1 IMPLANT
NDL SPNL 18GX3.5 QUINCKE PK (NEEDLE) IMPLANT
NEEDLE HYPO 25X1 1.5 SAFETY (NEEDLE) ×2 IMPLANT
NEEDLE SPNL 18GX3.5 QUINCKE PK (NEEDLE) ×2 IMPLANT
PACK LAMINECTOMY NEURO (CUSTOM PROCEDURE TRAY) ×2 IMPLANT
PAD DRESSING TELFA 3X8 NADH (GAUZE/BANDAGES/DRESSINGS) ×1 IMPLANT
PASSER CATH 36 CODMAN DISP (NEUROSURGERY SUPPLIES) ×1 IMPLANT
STAPLER VISISTAT 35W (STAPLE) ×2 IMPLANT
SUT VIC AB 2-0 CP2 18 (SUTURE) ×2 IMPLANT
SUT VIC AB 3-0 SH 8-18 (SUTURE) ×2 IMPLANT
SUT VICRYL RAPIDE 4/0 PS 2 (SUTURE) ×1 IMPLANT
SYSTEM CHARGING VERCISE (MISCELLANEOUS) IMPLANT

## 2021-12-17 NOTE — Anesthesia Postprocedure Evaluation (Signed)
Anesthesia Post Note  Patient: Maxwell Tate  Procedure(s) Performed: Electrode Extension and Placement of Pulse Generator for Deep Brain Stimulator (Left)     Patient location during evaluation: PACU Anesthesia Type: General Level of consciousness: awake and alert Pain management: pain level controlled Vital Signs Assessment: post-procedure vital signs reviewed and stable Respiratory status: spontaneous breathing, nonlabored ventilation, respiratory function stable and patient connected to nasal cannula oxygen Cardiovascular status: blood pressure returned to baseline and stable Postop Assessment: no apparent nausea or vomiting Anesthetic complications: no   No notable events documented.  Last Vitals:  Vitals:   12/17/21 1720 12/17/21 1736  BP: 134/87 125/90  Pulse: 68 65  Resp: 13 19  Temp:  36.6 C  SpO2: 95% 95%    Last Pain:  Vitals:   12/17/21 1736  PainSc: 1                  Collene Schlichter

## 2021-12-17 NOTE — H&P (Signed)
Providing Compassionate, Quality Care - Together  NEUROSURGERY HISTORY & PHYSICAL   Maxwell Tate is an 66 y.o. male.   Chief Complaint: Parkinson's disease  HPI: This is a 66 year old male with right upper extremity tremor due to Parkinson's disease.  He presents today for stage II left chest IPG placement after last week he underwent a left STN DBS lead placement.  Currently his incisions are healing well.  He has no new complaints at this time.  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   COVID 2021   cough only   History of kidney stones    Mixed hyperlipidemia    Neck pain 05/22/2020   Parkinson's disease    Personal history of colonic polyps    Primary hypothyroidism    Spinal stenosis, cervical region 05/22/2020   Type 2 diabetes mellitus without complication    Vitamin D deficiency     Past Surgical History:  Procedure Laterality Date   CARPAL TUNNEL RELEASE  2000   two weeks apart   HEMORRHOID SURGERY  2015   HEMORRHOID SURGERY  2005   KNEE SURGERY  2006   MINOR PLACEMENT OF FIDUCIAL N/A 12/01/2021   Procedure: Fiducial placement;  Surgeon: Bethann Goo, DO;  Location: [facility] OR;  Service: Neurosurgery;  Laterality: N/A;  MINOR ROOM   RADIOLOGY WITH ANESTHESIA N/A 10/29/2021   Procedure: MRI BRAIN WITH AND WITHOUT CONTRAST WITH ANESTHESIA;  Surgeon: Radiologist, Medication, MD;  Location: [facility] OR;  Service: Radiology;  Laterality: N/A;   REPLACEMENT TOTAL KNEE  2021   RHINOPLASTY  1996   ROTATOR CUFF REPAIR  2006   ROTATOR CUFF REPAIR  2009   SUBTHALAMIC STIMULATOR INSERTION Left 12/10/2021   Procedure: Deep brain stimulator placement;  Surgeon: Wanda Cellucci, Alan Mulder, DO;  Location: [facility] OR;  Service: Neurosurgery;  Laterality: Left;   TESTICLE REMOVAL  2012    Family History  Problem Relation Age of Onset   Breast cancer Mother 73   Diabetic kidney disease Mother 70   Heart  disease Father    Dementia Father        onset during mid to late 48s   Diabetes Sister    Lymphoma Brother    Parkinson's disease Maternal Grandfather    Crohn's disease Son    Social History:  reports that he has never smoked. He has never used smokeless tobacco. He reports that he does not currently use alcohol. He reports that he does not use drugs.  Allergies: No Known Allergies  Medications Prior to Admission  Medication Sig Dispense Refill   atorvastatin (LIPITOR) 40 MG tablet Take 40 mg by mouth in the morning.     Carbidopa-Levodopa ER (SINEMET CR) 25-100 MG tablet controlled release TAKE 2 TABS AT 7AM/ 11AM AND 4PM (Patient taking differently: Take 2 tabs at 7am/ 12pm and bedtime) 540 tablet 0   cholecalciferol (VITAMIN D3) 25 MCG (1000 UNIT) tablet Take 2,000 Units by mouth in the morning.     ciclopirox (PENLAC) 8 % solution Apply 1 application topically in the morning.     diclofenac (VOLTAREN) 75 MG EC tablet Take 75 mg by mouth in the morning.     entacapone (COMTAN) 200 MG tablet 1 TABLET AT 7AM/11AM/4PM WITH LEVODOPA DOSE (Patient taking differently: Take 1 tablet at 7am/ 12pm and bedtime) 270 tablet 0   HYDROcodone-acetaminophen (NORCO/VICODIN) 5-325 MG tablet Take  1-2 tablets by mouth every 6 (six) hours as needed for up to 7 days for moderate pain. 30 tablet 0   levothyroxine (SYNTHROID) 88 MCG tablet Take 88 mcg by mouth in the morning.     Multiple Vitamin (MULTIVITAMIN WITH MINERALS) TABS tablet Take 2 tablets by mouth daily.     Multiple Vitamins-Minerals (OCUVITE PRESERVISION PO) Take 1 tablet by mouth in the morning and at bedtime.     MYRBETRIQ 25 MG TB24 tablet Take 25 mg by mouth in the morning.     sildenafil (VIAGRA) 100 MG tablet Take 100 mg by mouth daily as needed for erectile dysfunction.     sitaGLIPtin-metformin (JANUMET) 50-1000 MG tablet Take 1 tablet by mouth in the morning.      Results for orders placed or performed during the hospital encounter  of 12/17/21 (from the past 48 hour(s))  Glucose, capillary     Status: Abnormal   Collection Time: 12/17/21  9:49 AM  Result Value Ref Range   Glucose-Capillary 135 (H) 70 - 99 mg/dL    Comment: Glucose reference range applies only to samples taken after fasting for at least 8 hours.  Glucose, capillary     Status: Abnormal   Collection Time: 12/17/21 11:57 AM  Result Value Ref Range   Glucose-Capillary 137 (H) 70 - 99 mg/dL    Comment: Glucose reference range applies only to samples taken after fasting for at least 8 hours.  Glucose, capillary     Status: Abnormal   Collection Time: 12/17/21  2:19 PM  Result Value Ref Range   Glucose-Capillary 123 (H) 70 - 99 mg/dL    Comment: Glucose reference range applies only to samples taken after fasting for at least 8 hours.   No results found.  ROS All positives and negatives are listed in HPI above  Blood pressure 137/88, pulse 65, temperature 97.8 F (36.6 C), resp. rate 17, height 5\' 9"  (1.753 m), weight 90.7 kg, SpO2 96 %. Physical Exam  Awake alert oriented x3 PERRLA EOMI Cranial nerves II through XII intact Incisions clean dry and intact Right upper extremity tremor Full strength in upper/lower extremities Sensory intact light touch  Assessment/Plan 66 year old male with  Parkinson's disease, status post left STN DBS lead placement   -OR today for stage II, left chest IPG placement.  All risks benefits and expected outcomes were discussed and agreed upon.  Informed consent was obtained.   Thank you for allowing me to participate in this patient's care.  Please do not hesitate to call with questions or concerns.   76, DO Neurosurgeon [facility] Cell: [phone]

## 2021-12-17 NOTE — Anesthesia Procedure Notes (Signed)
Procedure Name: LMA Insertion Date/Time: 12/17/2021 3:36 PM Performed by: Pearson Grippe, CRNA Pre-anesthesia Checklist: Patient identified, Emergency Drugs available, Suction available and Patient being monitored Patient Re-evaluated:Patient Re-evaluated prior to induction Oxygen Delivery Method: Circle system utilized Preoxygenation: Pre-oxygenation with 100% oxygen Induction Type: IV induction Ventilation: Mask ventilation without difficulty LMA: LMA inserted LMA Size: 5.0 Number of attempts: 1 Airway Equipment and Method: Bite block Placement Confirmation: positive ETCO2 Tube secured with: Tape Dental Injury: Teeth and Oropharynx as per pre-operative assessment

## 2021-12-17 NOTE — Anesthesia Preprocedure Evaluation (Addendum)
Anesthesia Evaluation  Patient identified by MRN, date of birth, ID band Patient awake    Reviewed: Allergy & Precautions, NPO status , Patient's Chart, lab work & pertinent test results  Airway Mallampati: II  TM Distance: >3 FB Neck ROM: Full    Dental  (+) Teeth Intact, Dental Advisory Given   Pulmonary neg pulmonary ROS,    Pulmonary exam normal breath sounds clear to auscultation       Cardiovascular negative cardio ROS Normal cardiovascular exam Rhythm:Regular Rate:Normal     Neuro/Psych PSYCHIATRIC DISORDERS Anxiety PD    GI/Hepatic negative GI ROS, Neg liver ROS,   Endo/Other  diabetes, Type 2Hypothyroidism   Renal/GU negative Renal ROS     Musculoskeletal  (+) Arthritis ,   Abdominal   Peds  Hematology negative hematology ROS (+)   Anesthesia Other Findings Day of surgery medications reviewed with the patient.  Reproductive/Obstetrics                             Anesthesia Physical Anesthesia Plan  ASA: 3  Anesthesia Plan: General   Post-op Pain Management: Tylenol PO (pre-op)   Induction: Intravenous  PONV Risk Score and Plan: 2 and Dexamethasone and Ondansetron  Airway Management Planned: LMA  Additional Equipment:   Intra-op Plan:   Post-operative Plan: Extubation in OR  Informed Consent: I have reviewed the patients History and Physical, chart, labs and discussed the procedure including the risks, benefits and alternatives for the proposed anesthesia with the patient or authorized representative who has indicated his/her understanding and acceptance.     Dental advisory given  Plan Discussed with: CRNA  Anesthesia Plan Comments:        Anesthesia Quick Evaluation

## 2021-12-17 NOTE — Transfer of Care (Signed)
Immediate Anesthesia Transfer of Care Note  Patient: Maxwell Tate  Procedure(s) Performed: Electrode Extension and Placement of Pulse Generator for Deep Brain Stimulator (Left)  Patient Location: PACU  Anesthesia Type:General  Level of Consciousness: drowsy and patient cooperative  Airway & Oxygen Therapy: Patient Spontanous Breathing and Patient connected to nasal cannula oxygen  Post-op Assessment: Report given to RN and Post -op Vital signs reviewed and stable  Post vital signs: Reviewed and stable  Last Vitals:  Vitals Value Taken Time  BP 121/84 12/17/21 1705  Temp    Pulse 72 12/17/21 1707  Resp 17 12/17/21 1707  SpO2 90 % 12/17/21 1707  Vitals shown include unvalidated device data.  Last Pain:  Vitals:   12/17/21 1012  PainSc: 0-No pain         Complications: No notable events documented.

## 2021-12-17 NOTE — Progress Notes (Signed)
° °  Providing Compassionate, Quality Care - Together  NEUROSURGERY PROGRESS NOTE   S: pt s/e in pacu, no issues  O: EXAM:  BP 134/87 (BP Location: Right Arm)    Pulse 68    Temp 97.8 F (36.6 C)    Resp 13    Ht 5\' 9"  (1.753 m)    Wt 90.7 kg    SpO2 95%    BMI 29.53 kg/m   Awake, alert, oriented  PERRL Speech fluent, appropriate  CNs grossly intact  5/5 BUE/BLE  Incisions c/d/I, neck soft   ASSESSMENT:  66 y.o. male with   Parkinsons Dz  S/p L IPG placement with lead extension  PLAN: - dc home -family updated -f.u 1 week    Thank you for allowing me to participate in this patient's care.  Please do not hesitate to call with questions or concerns.   76, DO Neurosurgeon [facility] Cell: [phone]

## 2021-12-17 NOTE — Op Note (Signed)
° °  Providing Compassionate, Quality Care - Together  Date of service: 12/17/2021  PREOP DIAGNOSIS:  Parkinson disease  POSTOP DIAGNOSIS: Same  PROCEDURE: Insertion of left implantable pulse generator along the left chest, [employer] Placement of extension lead from left STN DBS lead  SURGEON: Dr. Pieter Partridge C. Shanequa Whitenight, DO  ASSISTANT: Dr. Duffy Rhody, MD  ANESTHESIA: General Endotracheal  EBL: 50 cc  SPECIMENS: None  DRAINS: None  COMPLICATIONS: None  CONDITION: Hemodynamically stable  HISTORY: Maxwell Tate is a 66 y.o. male with a history of Parkinson disease that presented for placement of left IPG placement and extension lead from his prior left STN lead placement from 1 week ago.  We discussed all risks, benefits and expected outcomes and informed consent was obtained.  PROCEDURE IN DETAIL: The patient was brought to the operating room. After induction of general anesthesia, the patient was positioned on the operative table in the supine position. All pressure points were meticulously padded.  His head was gently turned to the right and a shoulder bump was placed.  The left parieto-occipital region was clipped free of hair.  Skin incision was then marked out and prepped and draped in the usual sterile fashion.  Physician driven timeout was performed.  Local anesthetic was injected into the wound.  Using a 10 blade, sharp incision was made over the prior tunneled STN DBS lead over the left parietal region.  Bovie electrocautery was used for hemostasis and the lead was identified.  This was gently taken out of the wound.  Using a 10 blade, a left superior chest incision was made and a pocket was created sharply with Metzenbaum scissors.  Battery template was placed and appeared to fit appropriately.  Hemostasis was achieved with monopolar cautery.  Using a tunneler, from the cranium to the superior chest a soft tissue of the neck was tunneled superior to the clavicle into  the chest incision.  The lead extension was then placed on the lead and this was passed gently through the tunneler.  Tunneler was removed.  The lead extension was gently tucked into the cranial incision.  The lead was then attached to the IPG.  Impedances were checked and noted to be excellent.  The battery was then placed in the pocket.  This was secured with a 3-0 Prolene suture.  The remaining lead was tucked behind the battery.  The wounds were copiously irrigated and noted to be excellently hemostatic.  Vancomycin powder was placed in both wounds.  The pocket was closed with 2-0 Vicryl sutures.  The dermis was then closed with 2-0 and 3-0 Vicryl sutures.  Skin was closed with skin glue.  Cranial incision was closed with 3-0 Vicryl sutures via the galea.  Skin was closed with a 4-0 Rapide Vicryl suture in a running fashion.  Sterile dressings were applied.  At the end of the case all sponge, needle, and instrument counts were correct. The patient was then transferred to the stretcher, extubated, and taken to the post-anesthesia care unit in stable hemodynamic condition.

## 2021-12-18 ENCOUNTER — Encounter (HOSPITAL_COMMUNITY): Payer: Self-pay | Admitting: Neurological Surgery

## 2021-12-23 ENCOUNTER — Other Ambulatory Visit: Payer: Self-pay | Admitting: Neurology

## 2021-12-23 DIAGNOSIS — G20 Parkinson's disease: Secondary | ICD-10-CM

## 2021-12-28 ENCOUNTER — Encounter: Payer: Self-pay | Admitting: Neurology

## 2022-01-01 NOTE — Progress Notes (Signed)
Assessment/Plan:   1.  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  DBS activated today.  He appears to have a little bit of tremor still sitting under the surface, but when we adjusted it, he would have more tingling in the hand.  We decided to leave it where it was for now.  He has an appointment in a few weeks and we can readjust then.  -Postoperative imaging reviewed and lead looks perfectly placed.  -He is only taking levodopa once per day.  For now, he will continue carbidopa/levodopa 25/100 CR, 2 tablets in the morning  -Continue entacapone 200 mg, 1 tablet with each levodopa dose (only taking it in the morning)  -Patient took self off pramipexole preoperatively.  Would have preferred to wean that.  -Levodopa challenge test done July 10, 2021, demonstrated some benefit to tremor, but he does have some evidence of levodopa resistant tremor.    -Neurocognitive testing done June 26, 2021 was unremarkable.         2.  RBD  -Safety discussed.  He still doesn't want medication for that. Subjective:   GRAVES NIPP was seen today in follow up.  Patient underwent left STN DBS on December 10, 2021.  Postoperative imaging indicates lead is well-placed.  Patient had IPG placed on February 9.  Patient did not restart the pramipexole after surgery.  He has been on levodopa and entacapone, but did stop those for today's visit.  He admits that he generally is only taking these once per day.  Current prescribed movement disorder medications: Carbidopa/levodopa 25/100 CR, 2 tablets at 7 AM/11 AM/4 PM (admits only taking once per day generally)  Entacapone, 200 mg, 1 tablet 3 times per day (takes once per day)  PREVIOUS MEDICATIONS: Carbidopa/levodopa 25/250  ALLERGIES:  No Known Allergies  CURRENT MEDICATIONS:  Outpatient Encounter Medications as of 01/04/2022  Medication Sig   atorvastatin (LIPITOR) 40 MG  tablet Take 40 mg by mouth in the morning.   Carbidopa-Levodopa ER (SINEMET CR) 25-100 MG tablet controlled release TAKE 2 TABS AT 7AM/ 11AM AND 4PM (Patient taking differently: Take 2 tabs at 7am/ 12pm and bedtime)   cholecalciferol (VITAMIN D3) 25 MCG (1000 UNIT) tablet Take 2,000 Units by mouth in the morning.   ciclopirox (PENLAC) 8 % solution Apply 1 application topically in the morning.   diclofenac (VOLTAREN) 75 MG EC tablet Take 75 mg by mouth in the morning.   entacapone (COMTAN) 200 MG tablet 1 TABLET AT 7AM/11AM/4PM WITH LEVODOPA DOSE (Patient taking differently: Take 1 tablet at 7am/ 12pm and bedtime)   levothyroxine (SYNTHROID) 88 MCG tablet Take 88 mcg by mouth in the morning.   Multiple Vitamin (MULTIVITAMIN WITH MINERALS) TABS tablet Take 2 tablets by mouth daily.   Multiple Vitamins-Minerals (OCUVITE PRESERVISION PO) Take 1 tablet by mouth in the morning and at bedtime.   MYRBETRIQ 25 MG TB24 tablet Take 25 mg by mouth in the morning.   sildenafil (VIAGRA) 100 MG tablet Take 100 mg by mouth daily as needed for erectile dysfunction.   sitaGLIPtin-metformin (JANUMET) 50-1000 MG tablet Take 1 tablet by mouth in the morning.   No facility-administered encounter medications on file as of 01/04/2022.    Objective:   PHYSICAL EXAMINATION:    VITALS:   Vitals:   01/04/22 1422  BP: 121/82  Pulse: 61  SpO2: 95%  Weight: 208 lb 3.2 oz (94.4 kg)  Height: 5\' 9"  (1.753 m)     GEN:  The patient appears stated age and is in NAD. HEENT:  Normocephalic, atraumatic.  The mucous membranes are moist. The superficial temporal arteries are without ropiness or tenderness. CV:  RRR Lungs:  CTAB Neck/HEME:  There are no carotid bruits bilaterally.   Neurological examination:   Orientation: The patient is alert and oriented x3. Cranial nerves: There is good facial symmetry with mild facial hypomimia. The speech is fluent and clear. Soft palate rises symmetrically and there is no tongue  deviation. Hearing is intact to conversational tone. Sensation: Sensation is intact to light touch throughout Motor: Strength is at least antigravity x4.  Tone: Post programming, tone in the right upper extremity is normal. Abnormal movements: there is near constant mod amplitude right upper extremity rest tremor prior to programming.  Post programming, there is rare, intermittent tremor. Coordination:  There is just minimal decremation on the right, mostly with finger taps.   Gait and Station: The patient easily arises out of the chair.  Negative pull test.     Cc:  , FNP

## 2022-01-04 ENCOUNTER — Ambulatory Visit: Payer: Medicare Other | Admitting: Neurology

## 2022-01-04 ENCOUNTER — Other Ambulatory Visit: Payer: Self-pay

## 2022-01-04 VITALS — BP 121/82 | HR 61 | Ht 69.0 in | Wt 208.2 lb

## 2022-01-04 DIAGNOSIS — G20 Parkinson's disease: Secondary | ICD-10-CM

## 2022-01-04 DIAGNOSIS — G4752 REM sleep behavior disorder: Secondary | ICD-10-CM

## 2022-01-04 NOTE — Procedures (Signed)
DBS Programming was performed.    Manufacturer of DBS device: [employer]  Total time spent programming was 60 minutes.  Device was turned on.  Soft start was confirmed to be on.  Impedences were checked and were within normal limits.  Battery was checked and was determined to be functioning normally and not near the end of life.  Final settings were as follows:    Active Contact Amplitude (mA) PW (ms) Frequency (hz) Side Effects  Left Brain       01/04/22 (5/6/7)-33%each C+ 3.0 60 159                        Right Brain       N/a                Other trials: (5/6)-C+ 2.7 70/154 (thumb numb)  (5/6)-(2/3)-C+  3.6 70/154 (poor tremor control)  (5/6)-8+C+  4.0  70/170 (poor tremor control)

## 2022-01-15 NOTE — Progress Notes (Signed)
? ? ?Assessment/Plan:  ? ?1.  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 -Patient is status post left STN DBS on December 10, 2021.   ? -Stop entacapone ? -He is only taking carbidopa/levodopa 25/100 CR, 2 tablets every morning.  In 1 week, he will go down to 1 tablet for 1 week and then discontinue the levodopa.  Did discuss with him that as his Parkinson's advances, he may have more difficulty on the left side of his body. ?       ? ?2.  RBD ? -Safety discussed.  He still doesn't want medication for that. ? ?3.  Speech change ? -not heard today ? -pt reports started before activation of his device, but after the battery surgery.  Suspect due to intubation during the battery surgery. ?Subjective:  ? ?Maxwell Tate was seen today in follow up.  Patient with his wife who supplements the history.  Patient underwent left STN DBS on December 10, 2021.  Postoperative imaging indicates lead is well-placed.  Patient had IPG placed on February 9.  Patient's device was activated last visit, January 04, 2022.  He reports that he has been doing fairly well.  About a week ago, he noticed some tremor when driving, but when he put his hand down, the tremor resolved. ? ?Current prescribed movement disorder medications: ?Carbidopa/levodopa 25/100 CR, 2 tablets at 7 AM/11 AM/4 PM (admits only taking once per day generally) ? ?Entacapone, 200 mg, 1 tablet 3 times per day (takes once per day) ? ?PREVIOUS MEDICATIONS: Carbidopa/levodopa 25/250 ? ?ALLERGIES:  No Known Allergies ? ?CURRENT MEDICATIONS:  ?Outpatient Encounter Medications as of 01/18/2022  ?Medication Sig  ? atorvastatin (LIPITOR) 40 MG tablet Take 40 mg by mouth in the morning.  ? Carbidopa-Levodopa ER (SINEMET CR) 25-100 MG tablet controlled release TAKE 2 TABS AT 7AM/ 11AM AND 4PM (Patient taking differently: Take 2 tabs at 7am/ 12pm and bedtime)  ? cholecalciferol (VITAMIN D3) 25 MCG (1000 UNIT) tablet Take 2,000 Units by mouth in the morning.  ?  ciclopirox (PENLAC) 8 % solution Apply 1 application topically in the morning.  ? diclofenac (VOLTAREN) 75 MG EC tablet Take 75 mg by mouth in the morning.  ? entacapone (COMTAN) 200 MG tablet 1 TABLET AT 7AM/11AM/4PM WITH LEVODOPA DOSE (Patient taking differently: Take 1 tablet at 7am/ 12pm and bedtime)  ? levothyroxine (SYNTHROID) 88 MCG tablet Take 88 mcg by mouth in the morning.  ? Multiple Vitamin (MULTIVITAMIN WITH MINERALS) TABS tablet Take 2 tablets by mouth daily.  ? Multiple Vitamins-Minerals (OCUVITE PRESERVISION PO) Take 1 tablet by mouth in the morning and at bedtime.  ? MYRBETRIQ 25 MG TB24 tablet Take 25 mg by mouth in the morning.  ? sildenafil (VIAGRA) 100 MG tablet Take 100 mg by mouth daily as needed for erectile dysfunction.  ? sitaGLIPtin-metformin (JANUMET) 50-1000 MG tablet Take 1 tablet by mouth in the morning.  ? ?No facility-administered encounter medications on file as of 01/18/2022.  ? ? ?Objective:  ? ?PHYSICAL EXAMINATION:   ? ?VITALS:   ?Vitals:  ? 01/18/22 1417  ?BP: 122/83  ?Pulse: 62  ?SpO2: 99%  ?Weight: 204 lb 6.4 oz (92.7 kg)  ?Height: 5\' 9"  (1.753 m)  ? ? ? ? ?GEN:  The patient appears stated age and is in NAD. ?HEENT:  Normocephalic, atraumatic.  The mucous membranes are moist. The superficial temporal arteries are without ropiness or tenderness. ? ?  ?Neurological examination: ?  ?Orientation: The patient  is alert and oriented x3. ?Cranial nerves: There is good facial symmetry with mild facial hypomimia. The speech is fluent and clear. Soft palate rises symmetrically and there is no tongue deviation. Hearing is intact to conversational tone. ?Sensation: Sensation is intact to light touch throughout ?Motor: Strength is at least antigravity x4. ? ?Tone: Normal ?Abnormal movements: Rare thumb tremor on the right.  Programming.  None post ?Coordination: None ?Gait and Station: The patient easily arises out of the chair.  Negative pull test.  There is decreased arm swing on the  left. ? ? ? ? ?Cc:  Blanchard Kelch, FNP ? ?

## 2022-01-18 ENCOUNTER — Ambulatory Visit: Payer: Medicare Other | Admitting: Neurology

## 2022-01-18 ENCOUNTER — Other Ambulatory Visit: Payer: Self-pay

## 2022-01-18 VITALS — BP 122/83 | HR 62 | Ht 69.0 in | Wt 204.4 lb

## 2022-01-18 DIAGNOSIS — R4789 Other speech disturbances: Secondary | ICD-10-CM | POA: Diagnosis not present

## 2022-01-18 DIAGNOSIS — G20 Parkinson's disease: Secondary | ICD-10-CM

## 2022-01-18 NOTE — Procedures (Signed)
DBS Programming was performed.   ? ?Manufacturer of DBS device: [employer] ? ?Total time spent programming was 15 minutes.  Device was confirmed on.  Soft start was confirmed to be on.  Impedences were checked and were within normal limits.  Battery was checked and was determined to be functioning normally and not near the end of life.  Final settings were as follows: ? ? ? Active Contact Amplitude (mA) PW (ms) Frequency (hz) Side Effects  ?Left Brain       ?01/04/22 (5/6/7)-33%each C+ 3.0 60 159   ?01/18/22 (5/6/7)-33%each C+ 3.1 60 159   ?       ?       ?Right Brain       ?N/a       ?       ?  ?Other trials: ?(5/6)-C+ 2.7 70/154 (thumb numb) ? ?(5/6)-(2/3)-C+  3.6 70/154 (poor tremor control) ? ?(5/6)-8+C+  4.0  70/170 (poor tremor control) ?

## 2022-01-18 NOTE — Patient Instructions (Signed)
Week 1 : ?Stop entacapone ? ?Week 2: ?Decrease carbidopa/levodopa 25/100 CR to 1 in the Am ? ?Week 3: ?STOP carbidopa/levodopa 25/100 CR ? ?Let me know if there are any problems with the above ?

## 2022-01-22 ENCOUNTER — Other Ambulatory Visit: Payer: Self-pay | Admitting: Neurology

## 2022-02-04 ENCOUNTER — Encounter: Payer: Self-pay | Admitting: Neurology

## 2022-02-05 NOTE — Progress Notes (Deleted)
? ? ?Assessment/Plan:  ? ?1.  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 -Patient is status post left STN DBS on December 10, 2021.   ? -Stop entacapone ? -He is only taking carbidopa/levodopa 25/100 CR, 2 tablets every morning.  In 1 week, he will go down to 1 tablet for 1 week and then discontinue the levodopa.  Did discuss with him that as his Parkinson's advances, he may have more difficulty on the left side of his body. ?       ? ?2.  RBD ? -Safety discussed.  He still doesn't want medication for that. ? ?3.  Speech change ? -not heard today ? -pt reports started before activation of his device, but after the battery surgery.  Suspect due to intubation during the battery surgery. ?Subjective:  ? ?Maxwell Tate was seen today in follow up.  Patient with his wife who supplements the history.  Patient underwent left STN DBS on December 10, 2021.  Postoperative imaging indicates lead is well-placed.  Patient had IPG placed on February 9.  Patient's device was activated last visit, January 04, 2022.  He was worked in today.  He called Thursday and stated that he noted tremor that day, which was unusual for him. ? ?Current prescribed movement disorder medications: ?Carbidopa/levodopa 25/100 CR, 2 tablets at 7 AM/11 AM/4 PM (admits only taking once per day generally) ? ?Entacapone, 200 mg, 1 tablet 3 times per day (takes once per day) ? ?PREVIOUS MEDICATIONS: Carbidopa/levodopa 25/250 ? ?ALLERGIES:  No Known Allergies ? ?CURRENT MEDICATIONS:  ?Outpatient Encounter Medications as of 02/09/2022  ?Medication Sig  ? atorvastatin (LIPITOR) 40 MG tablet Take 40 mg by mouth in the morning.  ? Carbidopa-Levodopa ER (SINEMET CR) 25-100 MG tablet controlled release TAKE 2 TABS AT 7AM/ 11AM AND 4PM (Patient taking differently: Take 2 tabs at 7am/ 12pm and bedtime)  ? cholecalciferol (VITAMIN D3) 25 MCG (1000 UNIT) tablet Take 2,000 Units by mouth in the morning.  ? ciclopirox (PENLAC) 8 % solution Apply 1  application topically in the morning.  ? diclofenac (VOLTAREN) 75 MG EC tablet Take 75 mg by mouth in the morning.  ? entacapone (COMTAN) 200 MG tablet 1 TABLET AT 7AM/11AM/4PM WITH LEVODOPA DOSE (Patient taking differently: Take 1 tablet at 7am/ 12pm and bedtime)  ? levothyroxine (SYNTHROID) 88 MCG tablet Take 88 mcg by mouth in the morning.  ? Multiple Vitamin (MULTIVITAMIN WITH MINERALS) TABS tablet Take 2 tablets by mouth daily.  ? Multiple Vitamins-Minerals (OCUVITE PRESERVISION PO) Take 1 tablet by mouth in the morning and at bedtime.  ? MYRBETRIQ 25 MG TB24 tablet Take 25 mg by mouth in the morning.  ? sildenafil (VIAGRA) 100 MG tablet Take 100 mg by mouth daily as needed for erectile dysfunction.  ? sitaGLIPtin-metformin (JANUMET) 50-1000 MG tablet Take 1 tablet by mouth in the morning.  ? ?No facility-administered encounter medications on file as of 02/09/2022.  ? ? ?Objective:  ? ?PHYSICAL EXAMINATION:   ? ?VITALS:   ?There were no vitals filed for this visit. ? ? ? ? ?GEN:  The patient appears stated age and is in NAD. ?HEENT:  Normocephalic, atraumatic.  The mucous membranes are moist. The superficial temporal arteries are without ropiness or tenderness. ? ?  ?Neurological examination: ?  ?Orientation: The patient is alert and oriented x3. ?Cranial nerves: There is good facial symmetry with mild facial hypomimia. The speech is fluent and clear. Soft palate rises symmetrically and there is  no tongue deviation. Hearing is intact to conversational tone. ?Sensation: Sensation is intact to light touch throughout ?Motor: Strength is at least antigravity x4. ? ?Tone: Normal ?Abnormal movements: Rare thumb tremor on the right.  Programming.  None post ?Coordination: None ?Gait and Station: The patient easily arises out of the chair.  Negative pull test.  There is decreased arm swing on the left. ? ? ? ? ?Cc:  Tresa Endo, FNP ? ?

## 2022-02-08 ENCOUNTER — Encounter: Payer: Self-pay | Admitting: Neurology

## 2022-02-09 ENCOUNTER — Encounter: Payer: Medicare Other | Admitting: Neurology

## 2022-03-30 ENCOUNTER — Encounter: Payer: Self-pay | Admitting: Neurology

## 2022-06-15 NOTE — Progress Notes (Unsigned)
Assessment/Plan:   1.  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    -Patient currently off of all Parkinson's medication.  -Patient understands that we may need to go back on Parkinson's medication if symptoms emerge on the left side of the body, since he has not had surgery for that side.         2.  RBD  -Safety discussed.  He still doesn't want medication for that.  3.  Speech change  -not heard today  -pt reports started before activation of his device, but after the battery surgery.  Suspect due to intubation during the battery surgery. Subjective:   Maxwell Tate was seen today in follow up.  Patient with his wife who supplements the history.  Patient doing well with his DBS/DBS charging.  He has had no falls.  Entacapone was stopped last visit and he had no trouble with that.  We also weaned him off of levodopa last visit.  Patient feels that he has had no significant trouble.    PREVIOUS MEDICATIONS: Carbidopa/levodopa 25/250; entacapone (stopped after surgery); carbidopa/levodopa 25/100 CR (discontinued after surgery)  ALLERGIES:  No Known Allergies  CURRENT MEDICATIONS:  Outpatient Encounter Medications as of 06/17/2022  Medication Sig   atorvastatin (LIPITOR) 40 MG tablet Take 40 mg by mouth in the morning.   Carbidopa-Levodopa ER (SINEMET CR) 25-100 MG tablet controlled release TAKE 2 TABS AT 7AM/ 11AM AND 4PM (Patient taking differently: Take 2 tabs at 7am/ 12pm and bedtime)   cholecalciferol (VITAMIN D3) 25 MCG (1000 UNIT) tablet Take 2,000 Units by mouth in the morning.   ciclopirox (PENLAC) 8 % solution Apply 1 application topically in the morning.   diclofenac (VOLTAREN) 75 MG EC tablet Take 75 mg by mouth in the morning.   entacapone (COMTAN) 200 MG tablet 1 TABLET AT 7AM/11AM/4PM WITH LEVODOPA DOSE (Patient taking differently: Take 1 tablet at 7am/ 12pm and bedtime)   levothyroxine  (SYNTHROID) 88 MCG tablet Take 88 mcg by mouth in the morning.   Multiple Vitamin (MULTIVITAMIN WITH MINERALS) TABS tablet Take 2 tablets by mouth daily.   Multiple Vitamins-Minerals (OCUVITE PRESERVISION PO) Take 1 tablet by mouth in the morning and at bedtime.   MYRBETRIQ 25 MG TB24 tablet Take 25 mg by mouth in the morning.   sildenafil (VIAGRA) 100 MG tablet Take 100 mg by mouth daily as needed for erectile dysfunction.   sitaGLIPtin-metformin (JANUMET) 50-1000 MG tablet Take 1 tablet by mouth in the morning.   No facility-administered encounter medications on file as of 06/17/2022.    Objective:   PHYSICAL EXAMINATION:    VITALS:   There were no vitals filed for this visit.     GEN:  The patient appears stated age and is in NAD. HEENT:  Normocephalic, atraumatic.  The mucous membranes are moist. The superficial temporal arteries are without ropiness or tenderness.    Neurological examination:   Orientation: The patient is alert and oriented x3. Cranial nerves: There is good facial symmetry with mild facial hypomimia. The speech is fluent and clear. Soft palate rises symmetrically and there is no tongue deviation. Hearing is intact to conversational tone. Sensation: Sensation is intact to light touch throughout Motor: Strength is at least antigravity x4.  Tone: Normal Abnormal movements: Rare thumb tremor on the right.  Programming.  None post Coordination: None Gait and Station: The patient easily arises out of the  chair.  Negative pull test.  There is decreased arm swing on the left.     Cc:  Blanchard Kelch, FNP

## 2022-06-17 ENCOUNTER — Ambulatory Visit: Payer: Medicare Other | Admitting: Neurology

## 2022-06-17 VITALS — BP 118/82 | HR 79 | Ht 69.0 in | Wt 212.0 lb

## 2022-06-17 DIAGNOSIS — G20 Parkinson's disease: Secondary | ICD-10-CM

## 2022-06-17 MED ORDER — CARBIDOPA-LEVODOPA ER 25-100 MG PO TBCR: EXTENDED_RELEASE_TABLET | ORAL | 0 refills | Status: DC

## 2022-06-17 NOTE — Patient Instructions (Signed)
Start carbidopa/levodopa 25/100 cr, 1 at 7am x 1 week, then 1 at 7am/11am x 1 week and then 1 at 7am/11am/4pm

## 2022-06-17 NOTE — Procedures (Signed)
DBS Programming was performed.    Manufacturer of DBS device: [employer]  Total time spent programming was 8 minutes.  Device was confirmed on.  Soft start was confirmed to be on.  Impedences were checked and were within normal limits.  Battery was checked and was determined to be functioning normally and not near the end of life.  Final settings were as follows:    Active Contact Amplitude (mA) PW (ms) Frequency (hz) Side Effects  Left Brain       01/04/22 (5/6/7)-33%each C+ 3.0 60 159   01/18/22 (5/6/7)-33%each C+ 3.1 60 159   06/17/22 (5/6/7)-33%each C+ 3.1 60 159          Right Brain       N/a                Other trials: (5/6)-C+ 2.7 70/154 (thumb numb)  (5/6)-(2/3)-C+  3.6 70/154 (poor tremor control)  (5/6)-8+C+  4.0  70/170 (poor tremor control)

## 2022-06-22 ENCOUNTER — Encounter: Payer: Medicare Other | Admitting: Neurology

## 2022-06-24 ENCOUNTER — Encounter: Payer: Medicare Other | Admitting: Neurology

## 2022-08-13 ENCOUNTER — Encounter: Payer: Self-pay | Admitting: Neurology

## 2022-09-24 ENCOUNTER — Encounter: Payer: Self-pay | Admitting: Neurology

## 2022-10-18 ENCOUNTER — Encounter: Payer: Self-pay | Admitting: Neurology

## 2022-10-18 NOTE — Progress Notes (Unsigned)
Assessment/Plan:   1.  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    -Pt inquires about R STN DBS as is now having more trouble on the L side of the body.  he would like to maintain ONE battery given that he hunts.  I think that this is possible to tunnel to the single IPG.  I called Dr. Jake Samples following the visit and he thinks he can do this.  -we will re-order pre-op MRI brain with and without but will need to be done on 1.5 T machine given he has current IPG in place.  Pt reports that needs to be done under anesthesia as last time we tried with oral valium and it didn't help and needs general.    -Continue carbidopa/levodopa 25/100 CR, 1 tablet at 7 AM/11 AM/4 PM   2.  RBD  -Safety discussed.  He still doesn't want medication for that.  Subjective:   Maxwell Tate was seen today in follow up.  Patient with his wife who supplements the history.  He restarted his daytime levodopa last visit, patient was previously discontinued after surgery.  We did this primarily because of symptoms on the left side, for which he does not have a DBS in place.  He has had no issues with charging his DBS.  Doing well on the R side of the body.  Frustrated with the L side.  Larey Seat one time - "I rolled on a hickory nut."  Was walking dog at night.  Had some near falls. No hallucinations.  He has active dreams.    Current movement disorder medications: Carbidopa/levodopa 25/100 CR, 1 tablet at 7 AM/11 AM/4 PM    PREVIOUS MEDICATIONS: Carbidopa/levodopa 25/250; entacapone (stopped after surgery); carbidopa/levodopa 25/100 CR (discontinued after surgery but then restarted); pramipexole  ALLERGIES:  No Known Allergies  CURRENT MEDICATIONS:  Outpatient Encounter Medications as of 10/19/2022  Medication Sig   atorvastatin (LIPITOR) 40 MG tablet Take 40 mg by mouth in the morning.   Carbidopa-Levodopa ER (SINEMET CR) 25-100 MG  tablet controlled release 1 po tid at 7am/11am/4pm   cholecalciferol (VITAMIN D3) 25 MCG (1000 UNIT) tablet Take 2,000 Units by mouth in the morning.   diclofenac (VOLTAREN) 75 MG EC tablet Take 75 mg by mouth in the morning.   levothyroxine (SYNTHROID) 100 MCG tablet Take 100 mcg by mouth every morning.   Multiple Vitamin (MULTIVITAMIN WITH MINERALS) TABS tablet Take 2 tablets by mouth daily.   Multiple Vitamins-Minerals (OCUVITE PRESERVISION PO) Take 1 tablet by mouth in the morning and at bedtime.   MYRBETRIQ 25 MG TB24 tablet Take 25 mg by mouth in the morning.   sildenafil (VIAGRA) 100 MG tablet Take 100 mg by mouth daily as needed for erectile dysfunction.   sitaGLIPtin-metformin (JANUMET) 50-1000 MG tablet Take 1 tablet by mouth in the morning.   testosterone cypionate (DEPOTESTOSTERONE CYPIONATE) 200 MG/ML injection INJECT 1 ML INTRAMUSCULARLY EVERY 2 WEEKS   [DISCONTINUED] ciclopirox (PENLAC) 8 % solution Apply 1 application topically in the morning.   No facility-administered encounter medications on file as of 10/19/2022.    Objective:   PHYSICAL EXAMINATION:    VITALS:   Vitals:   10/19/22 0942  BP: 132/82  Pulse: 81  SpO2: 98%  Weight: 211 lb 3.2 oz (95.8 kg)  Height: 5\' 9"  (1.753 m)   GEN:  The patient appears stated age and is in NAD. HEENT:  Normocephalic, atraumatic.  The mucous membranes are moist. The superficial temporal arteries are without ropiness or tenderness. CV:  RRR Lungs:  CTAB    Neurological examination:   Orientation: The patient is alert and oriented x3. Cranial nerves: There is good facial symmetry with mild facial hypomimia. The speech is fluent and clear. Soft palate rises symmetrically and there is no tongue deviation. Hearing is intact to conversational tone. Sensation: Sensation is intact to light touch throughout Motor: Strength is at least antigravity x4.  Tone: Normal Abnormal movements: there is LUE rest tremor, mod Coordination:  there is decreased RAMs on the L, with any form of RAMS, including alternating supination and pronation of the forearm, hand opening and closing, finger taps, heel taps and toe taps.  Gait and Station: The patient easily arises out of the chair.  Negative pull test.  There is decreased arm swing on the left.  He is ambulating well today  Total time spent on today's visit was 45  minutes, including both face-to-face time and nonface-to-face time.  Time included that spent on review of records (prior notes available to me/labs/imaging if pertinent), discussing treatment and goals, answering patient's questions and coordinating care.  This is independent of DBS time.   Cc:  Blanchard Kelch, FNP

## 2022-10-19 ENCOUNTER — Encounter: Payer: Self-pay | Admitting: Neurology

## 2022-10-19 ENCOUNTER — Telehealth: Payer: Self-pay

## 2022-10-19 ENCOUNTER — Ambulatory Visit: Payer: Medicare Other | Admitting: Neurology

## 2022-10-19 ENCOUNTER — Telehealth: Payer: Self-pay | Admitting: Neurology

## 2022-10-19 VITALS — BP 132/82 | HR 81 | Ht 69.0 in | Wt 211.2 lb

## 2022-10-19 DIAGNOSIS — G20A1 Parkinson's disease without dyskinesia, without mention of fluctuations: Secondary | ICD-10-CM

## 2022-10-19 DIAGNOSIS — Z01818 Encounter for other preprocedural examination: Secondary | ICD-10-CM | POA: Diagnosis not present

## 2022-10-19 MED ORDER — DIAZEPAM 5 MG PO TABS: ORAL_TABLET | ORAL | 0 refills | Status: DC

## 2022-10-19 NOTE — Telephone Encounter (Signed)
Spoke with [employer] rep as well as with radiology.  Patient needed MRI under general anesthesia because of claustrophobia and tremor.  Unfortunately, guidelines from the manufacturer require that patient be awake and alert in order to have an MRI with an IPG in place.  Radiology is unwilling to have him under anesthesia because of this.  I called the patient personally and discussed this with him.  We discussed that we would retry with the Valium, even though he failed it in the past.  Discussed how to take the Valium.  I also asked him to start taking 2 tablets of levodopa at a time over the next few days to see if that would decrease the tremor.  If it did not, he would trial up to 2.5 tablets and see if that helped and he was going to give me some feedback.  Patient did express understanding.  I will send Valium to his pharmacy for the MRI.

## 2022-10-19 NOTE — Procedures (Signed)
DBS Programming was performed.    Manufacturer of DBS device: [employer]  Total time spent programming was 10 minutes.  Device was confirmed on.  Soft start was confirmed to be on.  Impedences were checked and were within normal limits.  Battery was checked and was determined to be functioning normally and not near the end of life.  Final settings were as follows:    Active Contact Amplitude (mA) PW (ms) Frequency (hz) Side Effects  Left Brain       01/04/22 (5/6/7)-33%each C+ 3.0 60 159   01/18/22 (5/6/7)-33%each C+ 3.1 60 159   06/17/22 (5/6/7)-33%each C+ 3.1 60 159   10/19/22 (5/6/7)-33%each C+ 3.1 60 159          Right Brain       N/a                Other trials: (5/6)-C+ 2.7 70/154 (thumb numb)  (5/6)-(2/3)-C+  3.6 70/154 (poor tremor control)  (5/6)-8+C+  4.0  70/170 (poor tremor control)

## 2022-10-19 NOTE — Telephone Encounter (Signed)
Approval for MRI [id number] With and without Approval # [id number] Oct 19 2022-April 17 2023

## 2022-10-20 ENCOUNTER — Encounter: Payer: Self-pay | Admitting: Neurology

## 2022-10-28 ENCOUNTER — Encounter: Payer: Self-pay | Admitting: Neurology

## 2022-11-02 ENCOUNTER — Ambulatory Visit (HOSPITAL_COMMUNITY): Payer: Medicare Other

## 2022-11-04 ENCOUNTER — Ambulatory Visit (HOSPITAL_COMMUNITY)
Admission: RE | Admit: 2022-11-04 | Discharge: 2022-11-04 | Disposition: A | Discharge status: Home / Self Care | Payer: Medicare Other | Source: Ambulatory Visit | Attending: Neurology | Admitting: Neurology

## 2022-11-04 DIAGNOSIS — Z01818 Encounter for other preprocedural examination: Secondary | ICD-10-CM | POA: Diagnosis present

## 2022-11-04 MED ORDER — GADOBUTROL 1 MMOL/ML IV SOLN
10.0000 mL | Freq: Once | INTRAVENOUS | Status: AC | PRN
  Administered 2022-11-04: 10 mL via INTRAVENOUS

## 2022-11-05 NOTE — Telephone Encounter (Signed)
Eber Jones is out today but I will let patient know we are working on it

## 2022-11-12 ENCOUNTER — Encounter: Payer: Self-pay | Admitting: Neurology

## 2022-11-18 ENCOUNTER — Encounter: Payer: Self-pay | Admitting: Neurology

## 2022-12-02 ENCOUNTER — Other Ambulatory Visit: Payer: Self-pay | Admitting: Neurological Surgery

## 2022-12-02 ENCOUNTER — Other Ambulatory Visit (HOSPITAL_COMMUNITY): Payer: Self-pay | Admitting: Neurological Surgery

## 2022-12-02 DIAGNOSIS — G20A1 Parkinson's disease without dyskinesia, without mention of fluctuations: Secondary | ICD-10-CM

## 2023-01-04 ENCOUNTER — Ambulatory Visit (HOSPITAL_COMMUNITY): Payer: Medicare Other

## 2023-01-04 ENCOUNTER — Encounter (HOSPITAL_COMMUNITY): Payer: Self-pay | Admitting: Neurological Surgery

## 2023-01-04 ENCOUNTER — Encounter (HOSPITAL_COMMUNITY): Payer: Self-pay

## 2023-01-04 ENCOUNTER — Ambulatory Visit (HOSPITAL_COMMUNITY)
Admission: RE | Admit: 2023-01-04 | Discharge: 2023-01-04 | Disposition: A | Discharge status: Home / Self Care | Payer: Medicare Other | Attending: Neurological Surgery | Admitting: Neurological Surgery

## 2023-01-04 ENCOUNTER — Encounter (HOSPITAL_COMMUNITY)
Admission: RE | Disposition: A | Discharge status: Home / Self Care | Payer: Self-pay | Source: Home / Self Care | Attending: Neurological Surgery

## 2023-01-04 ENCOUNTER — Telehealth: Payer: Self-pay | Admitting: Neurology

## 2023-01-04 DIAGNOSIS — Z9682 Presence of neurostimulator: Secondary | ICD-10-CM | POA: Diagnosis not present

## 2023-01-04 DIAGNOSIS — G20A1 Parkinson's disease without dyskinesia, without mention of fluctuations: Secondary | ICD-10-CM | POA: Diagnosis not present

## 2023-01-04 SURGERY — MINOR PLACEMENT OF FIDUCIAL
Anesthesia: LOCAL

## 2023-01-04 MED ORDER — CEPHALEXIN 500 MG PO CAPS: 500.0000 mg | ORAL_CAPSULE | Freq: Two times a day (BID) | ORAL | 0 refills | Status: DC

## 2023-01-04 MED ORDER — HYDROCODONE-ACETAMINOPHEN 5-325 MG PO TABS: 1.0000 | ORAL_TABLET | Freq: Four times a day (QID) | ORAL | 0 refills | Status: DC | PRN

## 2023-01-04 SURGICAL SUPPLY — 25 items
BAG ATCL THK3 35X25 (MISCELLANEOUS) ×1 IMPLANT
BAG BIOHAZARD 25X35 (MISCELLANEOUS) ×1
BAG COUNTER SPONGE SURGICOUNT (BAG) ×1 IMPLANT
BLADE CLIPPER SPEC (BLADE) ×1 IMPLANT
BLADE SURG 11 STRL SS (BLADE) ×1 IMPLANT
BNDG ADH 1X3 SHEER STRL LF (GAUZE/BANDAGES/DRESSINGS) ×5 IMPLANT
COVER BACK TABLE 60X90IN (DRAPES) ×1 IMPLANT
DRAPE HALF SHEET 40X57 (DRAPES) ×1 IMPLANT
DRAPE SHEET LG 3/4 BI-LAMINATE (DRAPES) ×1 IMPLANT
GAUZE SPONGE 4X4 12PLY STRL (GAUZE/BANDAGES/DRESSINGS) ×3 IMPLANT
GLOVE BIO SURGEON STRL SZ8 (GLOVE) ×2 IMPLANT
GLOVE ECLIPSE 8.5 STRL (GLOVE) ×1 IMPLANT
GLOVE SRG 8 PF TXTR STRL LF DI (GLOVE) ×1 IMPLANT
GLOVE SURG LTX SZ8 (GLOVE) ×2 IMPLANT
GLOVE SURG UNDER POLY LF SZ8 (GLOVE) ×1
GLOVE SURG UNDER POLY LF SZ8.5 (GLOVE) ×1 IMPLANT
GOWN STRL REUS W/ TWL XL LVL3 (GOWN DISPOSABLE) ×1 IMPLANT
GOWN STRL REUS W/TWL XL LVL3 (GOWN DISPOSABLE) ×1
NEEDLE HYPO 18GX1.5 BLUNT FILL (NEEDLE) ×1 IMPLANT
NEEDLE HYPO 25X1 1.5 SAFETY (NEEDLE) ×1 IMPLANT
SOL PREP POV-IOD 4OZ 10% (MISCELLANEOUS) ×1 IMPLANT
STAPLER VISISTAT 35W (STAPLE) ×1 IMPLANT
SUT ETHILON 3 0 PS 1 (SUTURE) ×5 IMPLANT
SYR CONTROL 10ML LL (SYRINGE) ×1 IMPLANT
TOWEL GREEN STERILE (TOWEL DISPOSABLE) ×1 IMPLANT

## 2023-01-04 NOTE — Telephone Encounter (Signed)
Call patient and tell him to take his last dose of carbidopa/levodopa at 3/5 at the 11am dose and then NO MORE carbidopa/levodopa prior to his 3/7 surgery.  I hope he did well with his fiducials today.

## 2023-01-04 NOTE — Discharge Summary (Signed)
   Physician Discharge Summary  Patient ID: Maxwell Tate MRN: [id number] DOB/AGE: [birth year] 67 y.o.  Admit date: 01/04/2023 Discharge date: 01/04/2023  Admission Diagnoses:  Parkinson's disease with tremor  Discharge Diagnoses:  Same Active Problems:   * No active hospital problems. *   Discharged Condition: Stable  Hospital Course:  Maxwell Tate is a 67 y.o. male presenting for fiducial placement for surgical planning of right-sided DBS.  He tolerated placement well, underwent CT DBS planning.  He was discharged home, at his neurologic baseline.  Treatments: Fiducial placement for DBS planning  Discharge Exam: Blood pressure 132/81, pulse (!) 57, temperature 97.6 F (36.4 C), temperature source Oral, resp. rate 20, height 5' 9"$  (1.753 m), weight 91.2 kg, SpO2 95 %. Awake, alert, oriented x 3 Speech fluent, appropriate CN grossly intact 5/5 BUE/BLE Wounds c/d/i  Disposition: Discharge disposition: 01-Home or Self Care        Allergies as of 01/04/2023   No Known Allergies      Medication List     TAKE these medications    atorvastatin 40 MG tablet Commonly known as: LIPITOR Take 40 mg by mouth in the morning.   Carbidopa-Levodopa ER 25-100 MG tablet controlled release Commonly known as: SINEMET CR 1 po tid at 7am/11am/4pm   cephALEXin 500 MG capsule Commonly known as: KEFLEX Take 1 capsule (500 mg total) by mouth 2 (two) times daily for 20 days.   cholecalciferol 25 MCG (1000 UNIT) tablet Commonly known as: VITAMIN D3 Take 2,000 Units by mouth in the morning.   diazepam 5 MG tablet Commonly known as: Valium 1 tablet 45 min prior to procedure; repeat 30 min prior to procedure.  Use additional one if needed   diphenhydrAMINE 25 MG tablet Commonly known as: BENADRYL Take 25 mg by mouth at bedtime as needed for sleep.   HYDROcodone-acetaminophen 5-325 MG tablet Commonly known as: NORCO/VICODIN Take 1 tablet by mouth every 6 (six) hours as  needed for moderate pain.   Janumet XR 50-1000 MG Tb24 Generic drug: SitaGLIPtin-MetFORMIN HCl Take 1 tablet by mouth daily.   levothyroxine 100 MCG tablet Commonly known as: SYNTHROID Take 100 mcg by mouth every morning.   multivitamin with minerals Tabs tablet Take 2 tablets by mouth daily.   Myrbetriq 25 MG Tb24 tablet Generic drug: mirabegron ER Take 25 mg by mouth in the morning.   PreserVision AREDS 2 Caps Take 1 capsule by mouth daily.   testosterone cypionate 200 MG/ML injection Commonly known as: DEPOTESTOSTERONE CYPIONATE INJECT 1 ML INTRAMUSCULARLY EVERY 2 WEEKS         Signed: Theodoro Doing Kamela Blansett 01/04/2023, 9:29 AM

## 2023-01-04 NOTE — H&P (Signed)
Providing Compassionate, Quality Care - Together  NEUROSURGERY HISTORY & PHYSICAL   Maxwell Tate is an 67 y.o. male.   Chief Complaint: Tremor, left due to Parkinson's disease HPI: This is a 67 year old male with a history of tremor secondary to Parkinson's disease, presents today for placement of fiducials for planned right DBS placement in STN.  He has no complaints at this time.  He has a history of left-sided DBS placed approximately 1 year ago.  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   COVID 2021   cough only   History of kidney stones    Mixed hyperlipidemia    Neck pain 05/22/2020   Parkinson's disease    Personal history of colonic polyps    Primary hypothyroidism    Spinal stenosis, cervical region 05/22/2020   Type 2 diabetes mellitus without complication    Vitamin D deficiency     Past Surgical History:  Procedure Laterality Date   CARPAL TUNNEL RELEASE  2000   two weeks apart   HEMORRHOID SURGERY  2015   HEMORRHOID SURGERY  2005   KNEE SURGERY  2006   MINOR PLACEMENT OF FIDUCIAL N/A 12/01/2021   Procedure: Fiducial placement;  Surgeon: Karsten Ro, DO;  Location: [facility];  Service: Neurosurgery;  Laterality: N/A;  MINOR ROOM   PULSE GENERATOR IMPLANT Left 12/17/2021   Procedure: Electrode Extension and Placement of Pulse Generator for Deep Brain Stimulator;  Surgeon: Ellenore Roscoe, Theodoro Doing, DO;  Location: [facility];  Service: Neurosurgery;  Laterality: Left;   RADIOLOGY WITH ANESTHESIA N/A 10/29/2021   Procedure: MRI BRAIN WITH AND WITHOUT CONTRAST WITH ANESTHESIA;  Surgeon: Radiologist, Medication, MD;  Location: [facility];  Service: Radiology;  Laterality: N/A;   REPLACEMENT TOTAL KNEE  2021   RHINOPLASTY  1996   ROTATOR CUFF REPAIR  2006   ROTATOR CUFF REPAIR  2009   SUBTHALAMIC STIMULATOR INSERTION Left 12/10/2021   Procedure: Deep brain stimulator placement;  Surgeon: Allahna Husband,  Theodoro Doing, DO;  Location: [facility];  Service: Neurosurgery;  Laterality: Left;   TESTICLE REMOVAL  2012    Family History  Problem Relation Age of Onset   Breast cancer Mother 69   Diabetic kidney disease Mother 34   Heart disease Father    Dementia Father        onset during mid to late 3s   Diabetes Sister    Lymphoma Brother    Parkinson's disease Maternal Grandfather    Crohn's disease Son    Social History:  reports that he has never smoked. He has never used smokeless tobacco. He reports that he does not currently use alcohol. He reports that he does not use drugs.  Allergies: No Known Allergies  Medications Prior to Admission  Medication Sig Dispense Refill   atorvastatin (LIPITOR) 40 MG tablet Take 40 mg by mouth in the morning.     cholecalciferol (VITAMIN D3) 25 MCG (1000 UNIT) tablet Take 2,000 Units by mouth in the morning.     diphenhydrAMINE (BENADRYL) 25 MG tablet Take 25 mg by mouth at bedtime as needed for sleep.     levothyroxine (SYNTHROID) 100 MCG tablet Take 100 mcg by mouth every morning.     Multiple Vitamin (MULTIVITAMIN WITH MINERALS) TABS tablet Take 2 tablets by mouth daily.     Multiple Vitamins-Minerals (PRESERVISION AREDS 2) CAPS Take 1 capsule by mouth daily.  MYRBETRIQ 25 MG TB24 tablet Take 25 mg by mouth in the morning.     SitaGLIPtin-MetFORMIN HCl (JANUMET XR) 50-1000 MG TB24 Take 1 tablet by mouth daily.     testosterone cypionate (DEPOTESTOSTERONE CYPIONATE) 200 MG/ML injection INJECT 1 ML INTRAMUSCULARLY EVERY 2 WEEKS     Carbidopa-Levodopa ER (SINEMET CR) 25-100 MG tablet controlled release 1 po tid at 7am/11am/4pm (Patient not taking: Reported on 12/30/2022) 270 tablet 0   diazepam (VALIUM) 5 MG tablet 1 tablet 45 min prior to procedure; repeat 30 min prior to procedure.  Use additional one if needed (Patient not taking: Reported on 12/30/2022) 4 tablet 0    No results found for this or any previous visit (from the past 48 hour(s)). No results  found.  ROS All pertinent positives and negatives are listed HPI Blood pressure 132/81, pulse (!) 57, temperature 97.6 F (36.4 C), temperature source Oral, resp. rate 20, height '5\' 9"'$  (1.753 m), weight 91.2 kg, SpO2 95 %. Physical Exam  Awake alert oriented x 3, no acute distress PERRLA Speech fluent and appropriate Nonlabored breathing Cranial nerves II through XII intact Moves all extremities equally  Assessment/Plan 67 year old male with  Tremor due to Parkinson's disease  -Placement of fiducials today, with a CT DBS protocol.  Will discharge home with antibiotics and pain medication.  He is scheduled for March 7 for DBS placement.   Thank you for allowing me to participate in this patient's care.  Please do not hesitate to call with questions or concerns.   Elwin Sleight, [facility] Cell: [phone]

## 2023-01-04 NOTE — Procedures (Signed)
    Providing Compassionate, Quality Care - Together   Date of service: 01/04/2023   PREOP DIAGNOSIS:  Parkinson's disease, left upper extremity tremor   POSTOP DIAGNOSIS: Same   PROCEDURE: Placement of fiducials, x4   SURGEON: Dr. Pieter Partridge C. Yudith Norlander, DO   ASSISTANT: Caroline More, PA   ANESTHESIA: Local anesthetic   EBL: Minimal   SPECIMENS: None   DRAINS: None   COMPLICATIONS: None   CONDITION: Stable   HISTORY: Maxwell Tate is a 67 y.o. male with a history of Parkinson's disease, status post left DBS placement now with left upper extremity severe tremor.  Presents today for fiducial placement for planning for his right STN DBS placement at a later time.  We discussed all risks, benefits and expected outcomes, informed consent was obtained.   PROCEDURE IN DETAIL: The patient was brought to the minor procedure room.  Physician driven timeout was performed.  The head was clipped free of hair.  The 4 pin sites were sterilely prepped in a normal fashion.  Local anesthetic was injected in all 4 pin sites.  Using a 15 blade, a 0.5 cm incision was made down to the pericranium.  The fiducials were then tapped and placed with good bony purchase.  Incisions were closed with 3-0 monofilament suture.  Sterile dressing was applied.   At the end of the procedure all sponge, needle, and instrument counts were correct. The patient was then taken to CT with DBS protocol planning.  He was then discharged home with hydrocodone and Keflex.   The patient tolerated the procedure well with no complications.

## 2023-01-04 NOTE — Telephone Encounter (Signed)
Called patient and informed him to take his last dose of carbidopa/levodopa at 3/5 at the 11am dose and then NO MORE carbidopa/levodopa prior to his 3/7 surgery. Also informed patient that Dr. Carles Collet hopes he did well with his fiducials today.   Patient stated that he hasn't taken his Carbidopa Levodopa in a few months so that it won't be a problem. Also patient stated that his fiducials went well today.

## 2023-01-04 NOTE — Progress Notes (Signed)
Pt taken to waiting room via wheelchair to be discharged home with family. All questions answered by [place]ley.

## 2023-01-05 ENCOUNTER — Encounter: Payer: Self-pay | Admitting: Neurology

## 2023-01-11 ENCOUNTER — Encounter: Payer: Self-pay | Admitting: Neurology

## 2023-01-12 ENCOUNTER — Encounter (HOSPITAL_COMMUNITY): Payer: Self-pay | Admitting: Neurological Surgery

## 2023-01-12 ENCOUNTER — Other Ambulatory Visit: Payer: Self-pay

## 2023-01-12 NOTE — Progress Notes (Signed)
OR Main Desk, Buffy, made aware of patients stimulators and [employer] Rep Alonna Minium being contacted.

## 2023-01-12 NOTE — Progress Notes (Signed)
PCP - Dr. Tresa Endo Cardiologist - Denies  PPM/ICD - Patient has a subthalamic stimulator 12/10/2021; Pulse generator implant 12/17/2021. Instructed to bring remotes.  Device Orders - none Rep Notified - [employer] has been contacted.   Chest x-ray - n/a EKG - Dos 01/13/2023 Stress Test - Denies ECHO - Denies Cardiac Cath - Denies  Sleep study/apnea/CPAP: denies  Type II diabetic Fasting Blood Sugar - 117-120 Checks Blood Sugar every few weeks  Blood Thinner Instructions: Denies Aspirin Instructions: Denies  Patient is to take his las dose of Carbidopa/levodopa 01/11/2023 at 2300.  States he stopped taking this medication a few months ago.   ERAS Protcol - yes, clear liquids 3 hours prior to surgery.   COVID TEST- Denies  Anesthesia review: No  Patient verbally denies any shortness of breath, fever, cough and chest pain during phone call   -------------  SDW INSTRUCTIONS given:  Your procedure is scheduled on Thursday, January 13, 2023  Report to [facility] Main Entrance "A" at 5:30 A.M., and check in at the Admitting office.  Call this number if you have problems the morning of surgery:  [phone]   Remember:  Do not eat after midnight the night before your surgery  You may drink clear liquids until 4:30 the morning of your surgery.   Clear liquids allowed are: Water, Non-Citrus Juices (without pulp), Carbonated Beverages, Clear Tea, Black Coffee Only, and Gatorade   Take these medicines the morning of surgery with A SIP OF WATER  Atorvastatin, Keflex, Synthroid, Myrbetriq.    As needed: Flonase, Noro.   Last dose of Carbidopa-Levodopa ER (SINEMET CR) 01/11/2023 at 2024   As of today, STOP taking any Aspirin (unless otherwise instructed by your surgeon) Aleve, Naproxen, Ibuprofen, Motrin, Advil, Goody's, BC's, all herbal medications, fish oil, and all vitamins.    WHAT DO I DO ABOUT MY DIABETES MEDICATION?   Do not take oral diabetes  medicines (pills) the morning of surgery. Do NOT take SitaGLIPtin-MetFORMIN HCl (JANUMET XR) the morning of surgery.   The day of surgery, do not take other diabetes injectables, including Byetta (exenatide), Bydureon (exenatide ER), Victoza (liraglutide), or Trulicity (dulaglutide).  If your CBG is greater than 220 mg/dL, you may take  of your sliding scale (correction) dose of insulin.   HOW TO MANAGE YOUR DIABETES BEFORE AND AFTER SURGERY  Why is it important to control my blood sugar before and after surgery? Improving blood sugar levels before and after surgery helps healing and can limit problems. A way of improving blood sugar control is eating a healthy diet by:  Eating less sugar and carbohydrates  Increasing activity/exercise  Talking with your doctor about reaching your blood sugar goals High blood sugars (greater than 180 mg/dL) can raise your risk of infections and slow your recovery, so you will need to focus on controlling your diabetes during the weeks before surgery. Make sure that the doctor who takes care of your diabetes knows about your planned surgery including the date and location.  How do I manage my blood sugar before surgery? Check your blood sugar at least 4 times a day, starting 2 days before surgery, to make sure that the level is not too high or low.  Check your blood sugar the morning of your surgery when you wake up and every 2 hours until you get to the Short Stay unit.  If your blood sugar is less than 70 mg/dL, you will need to treat for low blood  sugar: Do not take insulin. Treat a low blood sugar (less than 70 mg/dL) with  cup of clear juice (cranberry or apple), 4 glucose tablets, OR glucose gel. Recheck blood sugar in 15 minutes after treatment (to make sure it is greater than 70 mg/dL). If your blood sugar is not greater than 70 mg/dL on recheck, call [phone] for further instructions. Report your blood sugar to the short stay nurse when you  get to Short Stay.  If you are admitted to the hospital after surgery: Your blood sugar will be checked by the staff and you will probably be given insulin after surgery (instead of oral diabetes medicines) to make sure you have good blood sugar levels. The goal for blood sugar control after surgery is 80-180 mg/dL.                        Do not wear lotions, powders, colognes, or deodorant. Men may shave face and neck.            Do not bring valuables to the hospital.             [facility] is not responsible for any belongings or valuables.  Do NOT Smoke (Tobacco/Vaping) 24 hours prior to your procedure  If you use a CPAP at night, you may bring all equipment for your overnight stay.   Contacts, glasses, dentures or bridgework may not be worn into surgery.      For patients admitted to the hospital, discharge time will be determined by your treatment team.   Patients discharged the day of surgery will not be allowed to drive home, and someone needs to stay with them for 24 hours.    Special instructions:   [facility]- Preparing For Surgery  Before surgery, you can play an important role. Because skin is not sterile, your skin needs to be as free of germs as possible. You can reduce the number of germs on your skin by washing with dial Soap before surgery.    Oral Hygiene is also important to reduce your risk of infection.  Remember - BRUSH YOUR TEETH THE MORNING OF SURGERY WITH YOUR REGULAR TOOTHPASTE   Please follow these instructions carefully.   Shower the NIGHT BEFORE SURGERY and the MORNING OF SURGERY with DIAL Soap.   Pat yourself dry with a CLEAN TOWEL.  Wear CLEAN PAJAMAS to bed the night before surgery  Place CLEAN SHEETS on your bed the night of your first shower   DO NOT SLEEP WITH PETS.   Day of Surgery: Please shower morning of surgery  Wear Clean/Comfortable clothing the morning of surgery Do not apply any deodorants/lotions.   Remember to brush  your teeth WITH YOUR REGULAR TOOTHPASTE.   Questions were answered. Patient verbalized understanding of instructions.

## 2023-01-13 ENCOUNTER — Other Ambulatory Visit: Payer: Self-pay

## 2023-01-13 ENCOUNTER — Inpatient Hospital Stay (HOSPITAL_COMMUNITY): Payer: Medicare Other

## 2023-01-13 ENCOUNTER — Inpatient Hospital Stay (HOSPITAL_COMMUNITY): Payer: Medicare Other | Admitting: Anesthesiology

## 2023-01-13 ENCOUNTER — Inpatient Hospital Stay (HOSPITAL_COMMUNITY)
Admission: RE | Admit: 2023-01-13 | Discharge: 2023-01-14 | DRG: 027 | Disposition: A | Discharge status: Home / Self Care | Payer: Medicare Other | Attending: Neurological Surgery | Admitting: Neurological Surgery

## 2023-01-13 ENCOUNTER — Encounter (HOSPITAL_COMMUNITY): Payer: Self-pay | Admitting: Neurological Surgery

## 2023-01-13 ENCOUNTER — Inpatient Hospital Stay (HOSPITAL_COMMUNITY)
Admission: RE | Disposition: A | Discharge status: Home / Self Care | Payer: Self-pay | Source: Home / Self Care | Attending: Neurological Surgery

## 2023-01-13 DIAGNOSIS — F419 Anxiety disorder, unspecified: Secondary | ICD-10-CM | POA: Diagnosis present

## 2023-01-13 DIAGNOSIS — E782 Mixed hyperlipidemia: Secondary | ICD-10-CM | POA: Diagnosis present

## 2023-01-13 DIAGNOSIS — M1711 Unilateral primary osteoarthritis, right knee: Secondary | ICD-10-CM | POA: Diagnosis present

## 2023-01-13 DIAGNOSIS — Z96659 Presence of unspecified artificial knee joint: Secondary | ICD-10-CM | POA: Diagnosis present

## 2023-01-13 DIAGNOSIS — E119 Type 2 diabetes mellitus without complications: Secondary | ICD-10-CM | POA: Diagnosis present

## 2023-01-13 DIAGNOSIS — Z7984 Long term (current) use of oral hypoglycemic drugs: Secondary | ICD-10-CM

## 2023-01-13 DIAGNOSIS — Z7951 Long term (current) use of inhaled steroids: Secondary | ICD-10-CM | POA: Diagnosis not present

## 2023-01-13 DIAGNOSIS — Z820 Family history of epilepsy and other diseases of the nervous system: Secondary | ICD-10-CM | POA: Diagnosis not present

## 2023-01-13 DIAGNOSIS — Z79899 Other long term (current) drug therapy: Secondary | ICD-10-CM

## 2023-01-13 DIAGNOSIS — Z9682 Presence of neurostimulator: Secondary | ICD-10-CM | POA: Diagnosis not present

## 2023-01-13 DIAGNOSIS — E039 Hypothyroidism, unspecified: Secondary | ICD-10-CM

## 2023-01-13 DIAGNOSIS — Z8249 Family history of ischemic heart disease and other diseases of the circulatory system: Secondary | ICD-10-CM | POA: Diagnosis not present

## 2023-01-13 DIAGNOSIS — Z841 Family history of disorders of kidney and ureter: Secondary | ICD-10-CM | POA: Diagnosis not present

## 2023-01-13 DIAGNOSIS — Z8616 Personal history of COVID-19: Secondary | ICD-10-CM | POA: Diagnosis not present

## 2023-01-13 DIAGNOSIS — Z807 Family history of other malignant neoplasms of lymphoid, hematopoietic and related tissues: Secondary | ICD-10-CM | POA: Diagnosis not present

## 2023-01-13 DIAGNOSIS — Z833 Family history of diabetes mellitus: Secondary | ICD-10-CM | POA: Diagnosis not present

## 2023-01-13 DIAGNOSIS — G20C Parkinsonism, unspecified: Secondary | ICD-10-CM

## 2023-01-13 DIAGNOSIS — Z86010 Personal history of colonic polyps: Secondary | ICD-10-CM | POA: Diagnosis not present

## 2023-01-13 DIAGNOSIS — G20A1 Parkinson's disease without dyskinesia, without mention of fluctuations: Secondary | ICD-10-CM | POA: Diagnosis present

## 2023-01-13 DIAGNOSIS — G20A2 Parkinson's disease without dyskinesia, with fluctuations: Secondary | ICD-10-CM | POA: Diagnosis not present

## 2023-01-13 DIAGNOSIS — Z87442 Personal history of urinary calculi: Secondary | ICD-10-CM

## 2023-01-13 DIAGNOSIS — Z79891 Long term (current) use of opiate analgesic: Secondary | ICD-10-CM | POA: Diagnosis not present

## 2023-01-13 HISTORY — PX: SUBTHALAMIC STIMULATOR INSERTION: SHX5375

## 2023-01-13 LAB — TYPE AND SCREEN
ABO/RH(D): B POS
Antibody Screen: NEGATIVE

## 2023-01-13 LAB — BASIC METABOLIC PANEL
Anion gap: 9 (ref 5–15)
BUN: 17 mg/dL (ref 8–23)
CO2: 26 mmol/L (ref 22–32)
Calcium: 8.9 mg/dL (ref 8.9–10.3)
Chloride: 102 mmol/L (ref 98–111)
Creatinine, Ser: 1.01 mg/dL (ref 0.61–1.24)
GFR, Estimated: >60 mL/min (ref >60)
Glucose, Bld: 140 mg/dL — ABNORMAL HIGH (ref 70–99)
Potassium: 3.9 mmol/L (ref 3.5–5.1)
Sodium: 137 mmol/L (ref 135–145)

## 2023-01-13 LAB — CBC
HCT: 47.8 % (ref 39.0–52.0)
Hemoglobin: 15.8 g/dL (ref 13.0–17.0)
MCH: 30.4 pg (ref 26.0–34.0)
MCHC: 33.1 g/dL (ref 30.0–36.0)
MCV: 91.9 fL (ref 80.0–100.0)
Platelets: 231 10*3/uL (ref 150–400)
RBC: 5.2 MIL/uL (ref 4.22–5.81)
RDW: 13.9 % (ref 11.5–15.5)
WBC: 5.4 10*3/uL (ref 4.0–10.5)
nRBC: 0 % (ref 0.0–0.2)

## 2023-01-13 LAB — GLUCOSE, CAPILLARY
Glucose-Capillary: 148 mg/dL — ABNORMAL HIGH (ref 70–99)
Glucose-Capillary: 134 mg/dL — ABNORMAL HIGH (ref 70–99)

## 2023-01-13 LAB — MRSA NEXT GEN BY PCR, NASAL: MRSA by PCR Next Gen: NOT DETECTED

## 2023-01-13 LAB — ABO/RH: ABO/RH(D): B POS

## 2023-01-13 SURGERY — SUBTHALAMIC STIMULATOR INSERTION
Anesthesia: Monitor Anesthesia Care | Laterality: Right

## 2023-01-13 MED ORDER — ORAL CARE MOUTH RINSE: 15.0000 mL | OROMUCOSAL | Status: DC | PRN

## 2023-01-13 MED ORDER — CHLORHEXIDINE GLUCONATE CLOTH 2 % EX PADS: 6.0000 | MEDICATED_PAD | Freq: Once | CUTANEOUS | Status: DC

## 2023-01-13 MED ORDER — LIDOCAINE HCL (CARDIAC) PF 100 MG/5ML IV SOSY
PREFILLED_SYRINGE | INTRAVENOUS | Status: DC | PRN
  Administered 2023-01-13: 20 mg via INTRATRACHEAL

## 2023-01-13 MED ORDER — SODIUM BICARBONATE 4.2 % IV SOLN
INTRAVENOUS | Status: DC | PRN
  Administered 2023-01-13: 50 meq via INTRAVENOUS

## 2023-01-13 MED ORDER — ACETAMINOPHEN 650 MG RE SUPP: 650.0000 mg | RECTAL | Status: DC | PRN

## 2023-01-13 MED ORDER — DIPHENHYDRAMINE HCL 25 MG PO CAPS
25.0000 mg | ORAL_CAPSULE | Freq: Every evening | ORAL | Status: DC | PRN
  Administered 2023-01-13: 25 mg via ORAL
  Filled 2023-01-13 (×2): qty 1

## 2023-01-13 MED ORDER — ORAL CARE MOUTH RINSE: 15.0000 mL | Freq: Once | OROMUCOSAL | Status: AC

## 2023-01-13 MED ORDER — FLUTICASONE PROPIONATE 50 MCG/ACT NA SUSP: 1.0000 | Freq: Every day | NASAL | Status: DC | PRN

## 2023-01-13 MED ORDER — HYDROCODONE-ACETAMINOPHEN 5-325 MG PO TABS
1.0000 | ORAL_TABLET | ORAL | Status: DC | PRN
  Administered 2023-01-13 – 2023-01-14 (×3): 1 via ORAL
  Filled 2023-01-13 (×3): qty 1

## 2023-01-13 MED ORDER — CHLORHEXIDINE GLUCONATE 0.12 % MT SOLN: 15.0000 mL | Freq: Once | OROMUCOSAL | Status: AC

## 2023-01-13 MED ORDER — PROPOFOL 10 MG/ML IV BOLUS
INTRAVENOUS | Status: DC | PRN
  Administered 2023-01-13 (×2): 10 mg via INTRAVENOUS

## 2023-01-13 MED ORDER — CHLORHEXIDINE GLUCONATE CLOTH 2 % EX PADS
6.0000 | MEDICATED_PAD | Freq: Every day | CUTANEOUS | Status: DC
  Administered 2023-01-13: 6 via TOPICAL

## 2023-01-13 MED ORDER — LACTATED RINGERS IV SOLN: INTRAVENOUS | Status: DC

## 2023-01-13 MED ORDER — LINAGLIPTIN 5 MG PO TABS
5.0000 mg | ORAL_TABLET | Freq: Every day | ORAL | Status: DC
  Administered 2023-01-14: 5 mg via ORAL
  Filled 2023-01-13: qty 1

## 2023-01-13 MED ORDER — INSULIN ASPART 100 UNIT/ML IJ SOLN: 0.0000 [IU] | INTRAMUSCULAR | Status: DC | PRN

## 2023-01-13 MED ORDER — BUPIVACAINE HCL (PF) 0.5 % IJ SOLN
INTRAMUSCULAR | Status: AC
  Filled 2023-01-13: qty 90

## 2023-01-13 MED ORDER — LABETALOL HCL 5 MG/ML IV SOLN: 10.0000 mg | INTRAVENOUS | Status: DC | PRN

## 2023-01-13 MED ORDER — HYDROMORPHONE HCL 1 MG/ML IJ SOLN
0.5000 mg | INTRAMUSCULAR | Status: DC | PRN
  Administered 2023-01-13: 1 mg via INTRAVENOUS
  Filled 2023-01-13: qty 1

## 2023-01-13 MED ORDER — FENTANYL CITRATE (PF) 250 MCG/5ML IJ SOLN
INTRAMUSCULAR | Status: DC | PRN
  Administered 2023-01-13 (×2): 25 ug via INTRAVENOUS

## 2023-01-13 MED ORDER — ACETAMINOPHEN 500 MG PO TABS
1000.0000 mg | ORAL_TABLET | Freq: Once | ORAL | Status: AC
  Administered 2023-01-13: 1000 mg via ORAL
  Filled 2023-01-13: qty 2

## 2023-01-13 MED ORDER — THROMBIN 5000 UNITS EX SOLR
CUTANEOUS | Status: AC
  Filled 2023-01-13: qty 5000

## 2023-01-13 MED ORDER — PROPOFOL 10 MG/ML IV BOLUS
INTRAVENOUS | Status: AC
  Filled 2023-01-13: qty 20

## 2023-01-13 MED ORDER — ACETAMINOPHEN 325 MG PO TABS
650.0000 mg | ORAL_TABLET | ORAL | Status: DC | PRN
  Administered 2023-01-13 (×2): 650 mg via ORAL
  Filled 2023-01-13 (×2): qty 2

## 2023-01-13 MED ORDER — PANTOPRAZOLE SODIUM 40 MG IV SOLR
40.0000 mg | Freq: Every day | INTRAVENOUS | Status: DC
  Administered 2023-01-13: 40 mg via INTRAVENOUS
  Filled 2023-01-13: qty 10

## 2023-01-13 MED ORDER — DOCUSATE SODIUM 100 MG PO CAPS
100.0000 mg | ORAL_CAPSULE | Freq: Two times a day (BID) | ORAL | Status: DC
  Administered 2023-01-13 (×2): 100 mg via ORAL
  Filled 2023-01-13 (×2): qty 1

## 2023-01-13 MED ORDER — MIRABEGRON ER 25 MG PO TB24
25.0000 mg | ORAL_TABLET | Freq: Every morning | ORAL | Status: DC
  Administered 2023-01-14: 25 mg via ORAL
  Filled 2023-01-13: qty 1

## 2023-01-13 MED ORDER — BUPIVACAINE HCL 0.5 % IJ SOLN
INTRAMUSCULAR | Status: DC | PRN
  Administered 2023-01-13: 9 mL

## 2023-01-13 MED ORDER — CEFAZOLIN SODIUM-DEXTROSE 2-4 GM/100ML-% IV SOLN
2.0000 g | INTRAVENOUS | Status: AC
  Administered 2023-01-13: 2 g via INTRAVENOUS
  Filled 2023-01-13: qty 100

## 2023-01-13 MED ORDER — CHLORHEXIDINE GLUCONATE 0.12 % MT SOLN
OROMUCOSAL | Status: AC
  Administered 2023-01-13: 15 mL via OROMUCOSAL
  Filled 2023-01-13: qty 15

## 2023-01-13 MED ORDER — PROMETHAZINE HCL 25 MG PO TABS: 12.5000 mg | ORAL_TABLET | ORAL | Status: DC | PRN

## 2023-01-13 MED ORDER — THROMBIN 5000 UNITS EX SOLR: OROMUCOSAL | Status: DC | PRN

## 2023-01-13 MED ORDER — ONDANSETRON HCL 4 MG/2ML IJ SOLN: 4.0000 mg | INTRAMUSCULAR | Status: DC | PRN

## 2023-01-13 MED ORDER — BACITRACIN ZINC 500 UNIT/GM EX OINT
TOPICAL_OINTMENT | CUTANEOUS | Status: DC | PRN
  Administered 2023-01-13: 1 via TOPICAL

## 2023-01-13 MED ORDER — CEFAZOLIN SODIUM-DEXTROSE 2-4 GM/100ML-% IV SOLN
2.0000 g | Freq: Three times a day (TID) | INTRAVENOUS | Status: AC
  Administered 2023-01-13 (×2): 2 g via INTRAVENOUS
  Filled 2023-01-13 (×2): qty 100

## 2023-01-13 MED ORDER — METFORMIN HCL 500 MG PO TABS
1000.0000 mg | ORAL_TABLET | Freq: Every day | ORAL | Status: DC
  Administered 2023-01-14: 1000 mg via ORAL
  Filled 2023-01-13: qty 2

## 2023-01-13 MED ORDER — LEVOTHYROXINE SODIUM 100 MCG PO TABS
100.0000 ug | ORAL_TABLET | Freq: Every day | ORAL | Status: DC
  Administered 2023-01-14: 100 ug via ORAL
  Filled 2023-01-13: qty 1

## 2023-01-13 MED ORDER — FENTANYL CITRATE (PF) 100 MCG/2ML IJ SOLN: 25.0000 ug | INTRAMUSCULAR | Status: DC | PRN

## 2023-01-13 MED ORDER — BACITRACIN ZINC 500 UNIT/GM EX OINT
TOPICAL_OINTMENT | CUTANEOUS | Status: AC
  Filled 2023-01-13: qty 28.35

## 2023-01-13 MED ORDER — LIDOCAINE-EPINEPHRINE 1 %-1:100000 IJ SOLN
INTRAMUSCULAR | Status: DC | PRN
  Administered 2023-01-13: 9 mL

## 2023-01-13 MED ORDER — LIDOCAINE-EPINEPHRINE 1 %-1:100000 IJ SOLN
INTRAMUSCULAR | Status: AC
  Filled 2023-01-13: qty 3

## 2023-01-13 MED ORDER — ONDANSETRON HCL 4 MG PO TABS
4.0000 mg | ORAL_TABLET | ORAL | Status: DC | PRN
  Administered 2023-01-13: 4 mg via ORAL
  Filled 2023-01-13: qty 1

## 2023-01-13 MED ORDER — FENTANYL CITRATE (PF) 250 MCG/5ML IJ SOLN
INTRAMUSCULAR | Status: AC
  Filled 2023-01-13: qty 5

## 2023-01-13 MED ORDER — SITAGLIP PHOS-METFORMIN HCL ER 50-1000 MG PO TB24: 1.0000 | ORAL_TABLET | Freq: Every day | ORAL | Status: DC

## 2023-01-13 SURGICAL SUPPLY — 76 items
ACC NRSTM CBL PSHBTN 8 CNTCT (NEUROSURGERY SUPPLIES) ×1
BAG COUNTER SPONGE SURGICOUNT (BAG) ×1 IMPLANT
BAG SPNG CNTER NS LX DISP (BAG) ×1
BIT DRILL NEURO 2X3.1 SFT TUCH (MISCELLANEOUS) IMPLANT
BLADE SURG 11 STRL SS (BLADE) ×2 IMPLANT
BLADE SURG 15 STRL LF DISP TIS (BLADE) IMPLANT
BLADE SURG 15 STRL SS (BLADE) ×1
BNDG ADH 1X3 SHEER STRL LF (GAUZE/BANDAGES/DRESSINGS) IMPLANT
BNDG ADH THN 3X1 STRL LF (GAUZE/BANDAGES/DRESSINGS) ×4
BNDG GAUZE DERMACEA FLUFF 4 (GAUZE/BANDAGES/DRESSINGS) ×2 IMPLANT
BNDG GZE DERMACEA 4 6PLY (GAUZE/BANDAGES/DRESSINGS) ×2
BOOT SUTURE VASCULAR YLW (MISCELLANEOUS)
CABLE DBS PUSH BUTTON 8 CONT (NEUROSURGERY SUPPLIES) IMPLANT
CABLE EXTENSION OR (CABLE) ×1
CABLE EXTENSION OR 8 CONTACT (CABLE) IMPLANT
CABLE LEAD 1.5 GUIDELINE 5.0 (CABLE) IMPLANT
CANISTER SUCT 3000ML PPV (MISCELLANEOUS) ×1 IMPLANT
CLAMP SUTURE YELLOW 5 PAIRS (MISCELLANEOUS) IMPLANT
CLIP RANEY DISP (INSTRUMENTS) IMPLANT
DRAPE STERI IOBAN 125X83 (DRAPES) IMPLANT
DRILL NEURO 2X3.1 SOFT TOUCH (MISCELLANEOUS)
DRSG OPSITE 4X5.5 SM (GAUZE/BANDAGES/DRESSINGS) ×2 IMPLANT
DRSG TEGADERM 2-3/8X2-3/4 SM (GAUZE/BANDAGES/DRESSINGS) ×2 IMPLANT
DRSG TELFA 3X8 NADH STRL (GAUZE/BANDAGES/DRESSINGS) ×1 IMPLANT
DURAPREP 26ML APPLICATOR (WOUND CARE) ×1 IMPLANT
GAUZE 4X4 16PLY ~~LOC~~+RFID DBL (SPONGE) IMPLANT
GAUZE SPONGE 4X4 12PLY STRL (GAUZE/BANDAGES/DRESSINGS) IMPLANT
GLOVE BIO SURGEON STRL SZ7 (GLOVE) ×2 IMPLANT
GLOVE BIOGEL PI IND STRL 7.0 (GLOVE) ×1 IMPLANT
GLOVE BIOGEL PI IND STRL 7.5 (GLOVE) ×1 IMPLANT
GLOVE BIOGEL PI IND STRL 8 (GLOVE) ×2 IMPLANT
GLOVE ECLIPSE 8.0 STRL XLNG CF (GLOVE) ×2 IMPLANT
GLOVE EXAM NITRILE XL STR (GLOVE) IMPLANT
GOWN STRL REUS W/ TWL LRG LVL3 (GOWN DISPOSABLE) IMPLANT
GOWN STRL REUS W/ TWL XL LVL3 (GOWN DISPOSABLE) ×2 IMPLANT
GOWN STRL REUS W/TWL 2XL LVL3 (GOWN DISPOSABLE) ×1 IMPLANT
GOWN STRL REUS W/TWL LRG LVL3 (GOWN DISPOSABLE)
GOWN STRL REUS W/TWL XL LVL3 (GOWN DISPOSABLE) ×2
HEMOSTAT POWDER KIT SURGIFOAM (HEMOSTASIS) ×1 IMPLANT
KIT BASIN OR (CUSTOM PROCEDURE TRAY) ×1 IMPLANT
KIT COVER BURR HOLE (Miscellaneous) ×1 IMPLANT
KIT HEADREST PASSIVE (NEUROSURGERY SUPPLIES) ×1 IMPLANT
KIT NEUROSTIM DBS 45 8 CONT (Miscellaneous) IMPLANT
KIT NEUROSTIM DBS W/BURR COVER (Miscellaneous) IMPLANT
KIT PLATFORM UNI 4LEG STARFIX (KITS) IMPLANT
KIT SURGICAL STARFIX WAYFORM (KITS) IMPLANT
KIT SUTURE REMOVAL HAMOT (SET/KITS/TRAYS/PACK) ×1 IMPLANT
KIT TURNOVER KIT B (KITS) ×1 IMPLANT
LEAD KIT DBS DIRECTIONAL 45C (Miscellaneous) ×1 IMPLANT
MARKER SKIN DUAL TIP RULER LAB (MISCELLANEOUS) ×2 IMPLANT
NDL HYPO 25X1 1.5 SAFETY (NEEDLE) ×2 IMPLANT
NDL SPNL 18GX3.5 QUINCKE PK (NEEDLE) IMPLANT
NDL SPNL 22GX3.5 QUINCKE BK (NEEDLE) IMPLANT
NEEDLE HYPO 25X1 1.5 SAFETY (NEEDLE) ×2 IMPLANT
NEEDLE SPNL 18GX3.5 QUINCKE PK (NEEDLE) IMPLANT
NEEDLE SPNL 22GX3.5 QUINCKE BK (NEEDLE) IMPLANT
NS IRRIG 1000ML POUR BTL (IV SOLUTION) ×1 IMPLANT
PACK LAMINECTOMY NEURO (CUSTOM PROCEDURE TRAY) ×1 IMPLANT
PAD ARMBOARD 7.5X6 YLW CONV (MISCELLANEOUS) ×2 IMPLANT
PERFORATOR LRG  14-11MM (BIT) ×1
PERFORATOR LRG 14-11MM (BIT) ×1 IMPLANT
PLATEFORM ARRAY DZAP LEADPOINT (MISCELLANEOUS) ×1
PLATFORM ARRAY DZAP LEADPOINT (MISCELLANEOUS) IMPLANT
PLATFORM BILATERAL KIT (MISCELLANEOUS) IMPLANT
SET SINGLE IT STRL (KITS) IMPLANT
SPIKE FLUID TRANSFER (MISCELLANEOUS) ×4 IMPLANT
SPONGE SURGIFOAM ABS GEL SZ50 (HEMOSTASIS) IMPLANT
STAPLER VISISTAT 35W (STAPLE) IMPLANT
SUT ETHILON 3 0 PS 1 (SUTURE) IMPLANT
SUT SILK 2 0 TIES 10X30 (SUTURE) ×1 IMPLANT
SUT VIC AB 2-0 CP2 18 (SUTURE) ×1 IMPLANT
TAG SUTURE CLAMP YLW 5PR (MISCELLANEOUS)
TOWEL GREEN STERILE (TOWEL DISPOSABLE) ×1 IMPLANT
TOWEL GREEN STERILE FF (TOWEL DISPOSABLE) ×1 IMPLANT
TRAY FOLEY MTR SLVR 16FR STAT (SET/KITS/TRAYS/PACK) IMPLANT
WATER STERILE IRR 1000ML POUR (IV SOLUTION) ×1 IMPLANT

## 2023-01-13 NOTE — Transfer of Care (Signed)
Immediate Anesthesia Transfer of Care Note  Patient: Maxwell Tate  Procedure(s) Performed: Right Deep brain stimulator placement (Right)  Patient Location: PACU  Anesthesia Type:MAC  Level of Consciousness: awake, alert , and oriented  Airway & Oxygen Therapy: Patient Spontanous Breathing  Post-op Assessment: Report given to RN and Post -op Vital signs reviewed and stable  Post vital signs: Reviewed and stable  Last Vitals:  Vitals Value Taken Time  BP 168/128 01/13/23 1130  Temp    Pulse 71 01/13/23 1132  Resp 15 01/13/23 1132  SpO2 95 % 01/13/23 1132  Vitals shown include unvalidated device data.  Last Pain:  Vitals:   01/13/23 0620  TempSrc:   PainSc: 0-No pain         Complications: No notable events documented.

## 2023-01-13 NOTE — Anesthesia Procedure Notes (Signed)
Procedure Name: MAC Date/Time: 01/13/2023 7:40 AM  Performed by: Mariea Clonts, CRNAPre-anesthesia Checklist: Patient identified, Emergency Drugs available, Suction available, Patient being monitored and Timeout performed Patient Re-evaluated:Patient Re-evaluated prior to induction Oxygen Delivery Method: Nasal cannula

## 2023-01-13 NOTE — Op Note (Signed)
Preoperative diagnosis:  Parkinsons Disease  Postoperative diagnosis:  same Surgeon:  Pieter Partridge Dawley Neurologist:  Wells Guiles Bedford Winsor  Indication for procedure: Determination of optimal electrode position for deep brain stimulation therapy. Complications: None   Description of procedure:  Following the incision and burr hole placement, a recording microelectrode was slowly advanced into the brain via a motorized drive.  Beginning approximately 10 mm above the target, recordings were periodically made, and the resultant brain activity was described on the neurophysiologic recording worksheet.   A total of 3 recording passes were obtained on the right side of the brain.  The initial pass did not reveal STN recording and microelectrode stimulation was with medial side effects.  Patient reported that he felt hot and some nausea at very low stimulation (1.18m).  The electrode was subsequently moved 2 mm directly lateral given prior medial side effects.  STN was only recorded for very brief period of time at 3.5 mm above target.  Micro stimulation revealed some benefit with tremor and rigidity.  There were no side effects with micro stimulation.  The electrode was then moved 2 millimeters posterolateral from its original position.  Good STN recording was not obtained and the electrode was moved back to the lateral position (second pass attempted).  The patient was somewhat tired and uncomfortable due to prolonged sitting and expressed desire to leave the lead where it was and stop the procedure.  Postoperative CT will be obtained for placement.   RAlonza Bogus DO LVirgieNeurology Director of Movement Disorders

## 2023-01-13 NOTE — Progress Notes (Signed)
This RN was given permission from Dr. Reatha Armour for patient to walk around the unit with no cardiac monitoring.   Normajean Baxter, RN

## 2023-01-13 NOTE — Progress Notes (Signed)
   Providing Compassionate, Quality Care - Together  NEUROSURGERY PROGRESS NOTE   S: Patient seen and examined in recovery, no complaints  O: EXAM:  BP 126/87 (BP Location: Right Arm)   Pulse 67   Temp 97.8 F (36.6 C) (Oral)   Resp 19   Ht '5\' 9"'$  (1.753 m)   Wt 91.2 kg   SpO2 95%   BMI 29.68 kg/m   Awake, alert, oriented x 3 Face symmetric PERRL Speech fluent, appropriate  CNs grossly intact  5/5 BUE/BLE  Dressings intact  ASSESSMENT:  67 y.o. male with   Left upper extremity tremor due to Parkinson's disease  -Status post right DBS placement  PLAN: -Will admit to ICU, SBP goal less than 150 -CT DBS protocol pending for DBS planning -Updated the family at this time.    Thank you for allowing me to participate in this patient's care.  Please do not hesitate to call with questions or concerns.   Elwin Sleight, [facility] Cell: [phone]

## 2023-01-13 NOTE — Anesthesia Preprocedure Evaluation (Signed)
Anesthesia Evaluation  Patient identified by MRN, date of birth, ID band Patient awake    Reviewed: Allergy & Precautions, NPO status , Patient's Chart, lab work & pertinent test results  Airway Mallampati: III  TM Distance: >3 FB Neck ROM: Full    Dental  (+) Dental Advisory Given   Pulmonary neg pulmonary ROS   breath sounds clear to auscultation       Cardiovascular negative cardio ROS  Rhythm:Regular Rate:Normal     Neuro/Psych  Neuromuscular disease    GI/Hepatic negative GI ROS, Neg liver ROS,,,  Endo/Other  diabetes, Type 2, Oral Hypoglycemic AgentsHypothyroidism    Renal/GU negative Renal ROS     Musculoskeletal  (+) Arthritis ,    Abdominal   Peds  Hematology negative hematology ROS (+)   Anesthesia Other Findings   Reproductive/Obstetrics                             Anesthesia Physical Anesthesia Plan  ASA: 2  Anesthesia Plan: MAC   Post-op Pain Management: Tylenol PO (pre-op)*   Induction:   PONV Risk Score and Plan: 1 and Ondansetron and Treatment may vary due to age or medical condition  Airway Management Planned: Natural Airway, Simple Face Mask and Nasal Cannula  Additional Equipment:   Intra-op Plan:   Post-operative Plan:   Informed Consent: I have reviewed the patients History and Physical, chart, labs and discussed the procedure including the risks, benefits and alternatives for the proposed anesthesia with the patient or authorized representative who has indicated his/her understanding and acceptance.       Plan Discussed with:   Anesthesia Plan Comments:        Anesthesia Quick Evaluation

## 2023-01-13 NOTE — Op Note (Signed)
Providing Compassionate, Quality Care - Together  Date of service: 01/13/2023  PREOP DIAGNOSIS:  Parkinson's disease, left upper extremity tremor  POSTOP DIAGNOSIS: Same  PROCEDURE: Right frontal STN DBS lead placement with Star fix Placement of [employer] directional DBS lead (45 cm 8 contact directional lead kit-Vercise cartesia) Intraoperative microelectrode recording 13  SURGEON: Dr. Pieter Partridge C. Dekota Shenk, DO  ASSISTANT: Caroline More, PA  ANESTHESIA: General Endotracheal  EBL: 50 cc  SPECIMENS: None.  DRAINS: None  COMPLICATIONS: None   CONDITION: Hemodynamically stable  HISTORY: Maxwell Tate is a 67 y.o. male with a longstanding history of Parkinson's disease with a history of a left frontal DBS lead, with progressive left upper extremity tremor.  Therefore he was offered a right frontal DBS lead placement for his left upper extremity tremor.  We discussed all risks, benefits and expected outcomes as well as alternatives to treatment and informed consent was obtained and witnessed.  He underwent fiducial placement and preoperative planning prior incoordination with Dr. Carles Collet.  PROCEDURE IN DETAIL: The patient was brought to the operating room. All pressure points were meticulously padded.  The cranium was clipped free of hair.  The prior fiducial sutures were removed.  The neck brace was placed with him and a slightly recumbent position sitting.  The frontal parietal occipital and temporal regions were prepped and draped in the usual sterile fashion. Physician driven timeout was performed.  Local anesthetic was injected into the prior fiducial incisions.  Using Metzenbaum scissors, the fiducial incisions were opened bluntly and the fiducials were identified.  The frame was then secured into place with the reduction screws.  We then used the reduction tower to identify our planned trajectory.  This was marked.  The frame was then removed and a curvilinear incision was  planned around the planned trajectory.  Local anesthetic was injected into the planned incision.  Using 15 blade, a curvilinear incision was made down to the paracranium.  Using Bovie electrocautery, the flap was reflected laterally.  A 2-0 Vicryl suture was used for retraction.  Hemostasis was achieved with monopolar cautery.  We then placed the foramen back into the fiducials and confirmed again our trajectory for placement of our bur hole and then remove the frame.  Using high-speed drill, bur hole was created.  The edges were cleared using 2 mm Kerrison.  Dura was identified.  Epidural hemostasis was achieved with Surgifoam.  Dura was anesthetized with local anesthetic on a cottonoid for a period of minutes.  We then used a 11 blade to create a cruciate durotomy, was coagulated with bipolar cautery.  Minimal CSF egress was noted.  The frame was then returned to identify where to make her corticectomy, there is no difficulty with bony borders.  The bur hole plate was placed with appropriate bony purchase.  Using bipolar electrocautery, a small region of the gyrus was cauterized and incised with a 15 blade.  Hemostasis achieved with bipolar cautery.  At this point the frame was returned as well as the Ben-gun.  The cannula was gently passed to a depth of 30 mm without difficulty.  We then placed the microelectrode and hooked up for recording.  Please see Dr. Doristine Devoid operative report for recording details.  Microelectrode recording was not ideal and with stimulation at this trajectory he had significant warmth feeling therefore we elected that we were medial.  We then repeated a trajectory 2 mm directly lateral with microelectrode recording.  This had somewhat improved recording with minimal  side effects with stimulation and some moderate control of his tremor.  Given our concern for not significant recording, we elected to move posterior lateral from the original trajectory of approximately 1.2 mm.  Recording again  and this trajectory was not appropriate however he had no side effects or control of his tremor.  Unfortunately the patient was not having difficulty sitting still and did not feel as though he could tolerate further recordings due to his anxiety.  Therefore we elected to place the lead in the second trajectory where he had some control of his tremor with minimal/no side effects.  This was completed by going and returning to the 2 mm lateral trajectory, the cannula was passed without difficulty.  The lead was then placed to our planned depth and the cannula was removed.  Hemostasis achieved with Surgifoam.  The plastic holder was placed and secured into the bur hole plate.  The ben-gun and frame were removed carefully.  The lead was tunneled medially using uterine forceps along the paracranium replacing the distal lead cover and final tightening to the manufacturer's recommendation.  The bur hole was copiously irrigated and noted to be excellently hemostatic.  Bur hole cover was placed and secured appropriately.  Retraction was removed.  Hemostasis was achieved with bipolar cautery.  The wound was then closed in layers, 2-0 Vicryl sutures for the galea, skin was closed with staples.  The posterior fiducials were removed and these incisions were closed with staples.  Anterior fiducials had still significant bony purchase and therefore we elected to leave for possible adjustment of the lead based off our postoperative CT.  The anterior incisions were closed with 3-0 nylon sutures in a running fashion.  Sterile dressings were applied.  At the end of the case all sponge, needle, and instrument counts were correct. The patient was then transferred to the stretcher, and taken to the post-anesthesia care unit in stable hemodynamic condition.

## 2023-01-13 NOTE — Progress Notes (Addendum)
Patient being transported to CT before going to 4N room per Dr. Reatha Armour. Patient on monitor and stable. RN to transport. Patient tolerated CT, no neuro changes

## 2023-01-13 NOTE — H&P (Signed)
Providing Compassionate, Quality Care - Together  NEUROSURGERY HISTORY & PHYSICAL   Maxwell Tate is an 67 y.o. male.   Chief Complaint: LUE tremor due to PD HPI: This is a 67 year old male with a history of Parkinson's disease, with worsening left upper extremity tremor.  He has a history of left-sided DBS for tremor 1 year ago.  He has been tolerating this well, and presents today for surgical placement of right-sided DBS.  At this time he has no complaints.  His fiducial placement was tolerated well, CT was obtained and frame was 3D printed custom for him.  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   COVID 2021   cough only   History of kidney stones    Mixed hyperlipidemia    Neck pain 05/22/2020   Parkinson's disease    Personal history of colonic polyps    Primary hypothyroidism    Spinal stenosis, cervical region 05/22/2020   Type 2 diabetes mellitus without complication    Vitamin D deficiency     Past Surgical History:  Procedure Laterality Date   CARPAL TUNNEL RELEASE  2000   two weeks apart   HEMORRHOID SURGERY  2015   HEMORRHOID SURGERY  2005   KNEE SURGERY  2006   MINOR PLACEMENT OF FIDUCIAL N/A 12/01/2021   Procedure: Fiducial placement;  Surgeon: Karsten Ro, DO;  Location: [place];  Service: Neurosurgery;  Laterality: N/A;  MINOR ROOM   PULSE GENERATOR IMPLANT Left 12/17/2021   Procedure: Electrode Extension and Placement of Pulse Generator for Deep Brain Stimulator;  Surgeon: Ysmael Hires, Theodoro Doing, DO;  Location: [place];  Service: Neurosurgery;  Laterality: Left;   RADIOLOGY WITH ANESTHESIA N/A 10/29/2021   Procedure: MRI BRAIN WITH AND WITHOUT CONTRAST WITH ANESTHESIA;  Surgeon: Radiologist, Medication, MD;  Location: [place];  Service: Radiology;  Laterality: N/A;   REPLACEMENT TOTAL KNEE  2021   RHINOPLASTY  1996   ROTATOR CUFF REPAIR  2006   ROTATOR CUFF REPAIR  2009    SUBTHALAMIC STIMULATOR INSERTION Left 12/10/2021   Procedure: Deep brain stimulator placement;  Surgeon: Cruzita Lipa, Theodoro Doing, DO;  Location: [place];  Service: Neurosurgery;  Laterality: Left;   TESTICLE REMOVAL  2012    Family History  Problem Relation Age of Onset   Breast cancer Mother 58   Diabetic kidney disease Mother 23   Heart disease Father    Dementia Father        onset during mid to late 77s   Diabetes Sister    Lymphoma Brother    Parkinson's disease Maternal Grandfather    Crohn's disease Son    Social History:  reports that he has never smoked. He has never used smokeless tobacco. He reports that he does not currently use alcohol. He reports that he does not use drugs.  Allergies: No Known Allergies  Medications Prior to Admission  Medication Sig Dispense Refill   atorvastatin (LIPITOR) 40 MG tablet Take 40 mg by mouth in the morning.     cephALEXin (KEFLEX) 500 MG capsule Take 1 capsule (500 mg total) by mouth 2 (two) times daily for 20 days. 20 capsule 0   cholecalciferol (VITAMIN D3) 25 MCG (1000 UNIT) tablet Take 2,000 Units by mouth in the morning.     diphenhydrAMINE (BENADRYL) 25 MG tablet Take 25 mg by mouth at bedtime as needed for sleep. [facility]  sleepaid     fluticasone (FLONASE) 50 MCG/ACT nasal spray Place 1 spray into both nostrils daily as needed for rhinitis.     HYDROcodone-acetaminophen (NORCO/VICODIN) 5-325 MG tablet Take 1 tablet by mouth every 6 (six) hours as needed for moderate pain. 20 tablet 0   levothyroxine (SYNTHROID) 100 MCG tablet Take 100 mcg by mouth every morning.     Multiple Vitamin (MULTIVITAMIN WITH MINERALS) TABS tablet Take 2 tablets by mouth daily.     Multiple Vitamins-Minerals (PRESERVISION AREDS 2) CAPS Take 1 capsule by mouth daily.     MYRBETRIQ 25 MG TB24 tablet Take 25 mg by mouth in the morning.     SitaGLIPtin-MetFORMIN HCl (JANUMET XR) 50-1000 MG TB24 Take 1 tablet by mouth daily.     testosterone cypionate (DEPOTESTOSTERONE  CYPIONATE) 200 MG/ML injection INJECT 1 ML INTRAMUSCULARLY EVERY 2 WEEKS     Carbidopa-Levodopa ER (SINEMET CR) 25-100 MG tablet controlled release 1 po tid at 7am/11am/4pm (Patient not taking: Reported on 12/30/2022) 270 tablet 0   diclofenac (VOLTAREN) 75 MG EC tablet Take 75 mg by mouth daily.      Results for orders placed or performed during the hospital encounter of 01/13/23 (from the past 48 hour(s))  CBC per protocol     Status: None   Collection Time: 01/13/23  6:11 AM  Result Value Ref Range   WBC 5.4 4.0 - 10.5 K/uL   RBC 5.20 4.22 - 5.81 MIL/uL   Hemoglobin 15.8 13.0 - 17.0 g/dL   HCT 47.8 39.0 - 52.0 %   MCV 91.9 80.0 - 100.0 fL   MCH 30.4 26.0 - 34.0 pg   MCHC 33.1 30.0 - 36.0 g/dL   RDW 13.9 11.5 - 15.5 %   Platelets 231 150 - 400 K/uL   nRBC 0.0 0.0 - 0.2 %    Comment: Performed at [facility], [place] [address], [place], [place] [address]  Type and screen     Status: None (Preliminary result)   Collection Time: 01/13/23  6:34 AM  Result Value Ref Range   ABO/RH(D) PENDING    Antibody Screen PENDING    Sample Expiration      01/16/2023,2359 Performed at [facility], [place] [address] [place], [place] [address]   ABO/Rh     Status: None (Preliminary result)   Collection Time: 01/13/23  6:40 AM  Result Value Ref Range   ABO/RH(D) PENDING    No results found.  ROS All pertinent positives and negatives are listed in HPI above  Blood pressure 130/89, pulse 65, temperature 97.8 F (36.6 C), temperature source Oral, resp. rate 18, height '5\' 9"'$  (1.753 m), weight 91.2 kg, SpO2 93 %. Physical Exam  Awake alert oriented x 3, PERRLA Incisions clean dry and intact Cranial nerves II through XII intact Speech fluent and appropriate Nonlabored breathing Resting left upper extremity tremor Moves all extremities equally, full strength  Assessment/Plan  67 year old male with  Tremor due to Parkinson's disease  -OR today for right DBS lead placement and  microelectrode recording.  All risks, benefits and expected outcomes were discussed and agreed upon.  Informed consent was obtained and witnessed.  Thank you for allowing me to participate in this patient's care.  Please do not hesitate to call with questions or concerns.   Elwin Sleight, [facility] Cell: [phone]

## 2023-01-14 ENCOUNTER — Encounter: Payer: Self-pay | Admitting: Neurology

## 2023-01-14 ENCOUNTER — Encounter (HOSPITAL_COMMUNITY): Payer: Self-pay | Admitting: Neurological Surgery

## 2023-01-14 MED ORDER — CEPHALEXIN 500 MG PO CAPS: 500.0000 mg | ORAL_CAPSULE | Freq: Two times a day (BID) | ORAL | 0 refills | Status: DC

## 2023-01-14 MED ORDER — HYDROCODONE-ACETAMINOPHEN 5-325 MG PO TABS: 1.0000 | ORAL_TABLET | Freq: Four times a day (QID) | ORAL | 0 refills | Status: DC | PRN

## 2023-01-14 MED FILL — Thrombin For Soln 5000 Unit: CUTANEOUS | Qty: 5000 | Status: AC

## 2023-01-14 NOTE — Anesthesia Postprocedure Evaluation (Signed)
Anesthesia Post Note  Patient: Maxwell Tate  Procedure(s) Performed: Right Deep brain stimulator placement (Right)     Patient location during evaluation: PACU Anesthesia Type: MAC Level of consciousness: awake and alert Pain management: pain level controlled Vital Signs Assessment: post-procedure vital signs reviewed and stable Respiratory status: spontaneous breathing, nonlabored ventilation, respiratory function stable and patient connected to nasal cannula oxygen Cardiovascular status: stable and blood pressure returned to baseline Postop Assessment: no apparent nausea or vomiting Anesthetic complications: no   No notable events documented.  Last Vitals:  Vitals:   01/14/23 0600 01/14/23 0700  BP: 117/83 106/82  Pulse:    Resp:  15  Temp:    SpO2:      Last Pain:  Vitals:   01/14/23 0400  TempSrc: Oral  PainSc: Asleep   Pain Goal:                   Tiajuana Amass

## 2023-01-14 NOTE — Discharge Summary (Signed)
  Physician Discharge Summary  Patient ID: Maxwell Tate MRN: [id number] DOB/AGE: 03/31/1956 67 y.o.  Admit date: 01/13/2023 Discharge date: 01/14/2023  Admission Diagnoses:  Left upper extremity tremor due to Parkinson's disease  Discharge Diagnoses:  Same Principal Problem:   Parkinsons   Discharged Condition: Stable  Hospital Course:  Maxwell Tate is a 67 y.o. male underwent elective right frontal STN DBS placement for left upper extremity tremor.  He tolerated the surgery well.  Postoperative CT revealed no acute hemorrhage or malplacement.  He was monitored overnight in the neuro ICU, his blood pressure remained stable.  He was neurologically at his baseline.  He was tolerating normal diet, ambulating independently and having normal bowel bladder function.  His incisions were clean dry and intact, he had an uneventful hospitalization course.  Treatments: Surgery -right frontal STN DBS lead placement  Discharge Exam: Blood pressure 106/82, pulse 82, temperature 98 F (36.7 C), temperature source Oral, resp. rate 15, height 5\' 9"  (1.753 m), weight 91.2 kg, SpO2 100 %. Awake, alert, oriented x 3 Speech fluent, appropriate CN grossly intact 5/5 BUE/BLE Wounds c/d/i  Disposition: Discharge disposition: 01-Home or Self Care        Allergies as of 01/14/2023   No Known Allergies      Medication List     TAKE these medications    atorvastatin 40 MG tablet Commonly known as: LIPITOR Take 40 mg by mouth in the morning.   Carbidopa-Levodopa ER 25-100 MG tablet controlled release Commonly known as: SINEMET CR 1 po tid at 7am/11am/4pm   cephALEXin 500 MG capsule Commonly known as: KEFLEX Take 1 capsule (500 mg total) by mouth 2 (two) times daily for 20 days.   cholecalciferol 25 MCG (1000 UNIT) tablet Commonly known as: VITAMIN D3 Take 2,000 Units by mouth in the morning.   diclofenac 75 MG EC tablet Commonly known as: VOLTAREN Take 75 mg by mouth daily.    diphenhydrAMINE 25 MG tablet Commonly known as: BENADRYL Take 25 mg by mouth at bedtime as needed for sleep. [facility] sleepaid   fluticasone 50 MCG/ACT nasal spray Commonly known as: FLONASE Place 1 spray into both nostrils daily as needed for rhinitis.   HYDROcodone-acetaminophen 5-325 MG tablet Commonly known as: NORCO/VICODIN Take 1 tablet by mouth every 6 (six) hours as needed for moderate pain.   Janumet XR 50-1000 MG Tb24 Generic drug: SitaGLIPtin-MetFORMIN HCl Take 1 tablet by mouth daily.   levothyroxine 100 MCG tablet Commonly known as: SYNTHROID Take 100 mcg by mouth every morning.   multivitamin with minerals Tabs tablet Take 2 tablets by mouth daily.   Myrbetriq 25 MG Tb24 tablet Generic drug: mirabegron ER Take 25 mg by mouth in the morning.   PreserVision AREDS 2 Caps Take 1 capsule by mouth daily.   testosterone cypionate 200 MG/ML injection Commonly known as: DEPOTESTOSTERONE CYPIONATE INJECT 1 ML INTRAMUSCULARLY EVERY 2 WEEKS        Follow-up Information     Karell Tukes C, DO Follow up in 2 week(s).   Contact information: [address] [place] [address] [phone]                 Signed: Theodoro Doing Evaline Waltman 01/14/2023, 7:41 AM

## 2023-01-24 ENCOUNTER — Encounter: Payer: Medicare Other | Admitting: Neurology

## 2023-01-25 NOTE — Progress Notes (Addendum)
PCP - Dr. Tresa Endo Cardiologist - Denies  PPM/ICD - Patient has a subthalamic stimulator 12/10/2021; Pulse generator implant 12/17/2021. Instructed to bring remotes. Device Orders - none Rep Notified - [employer] Rep Alonna Minium has been contacted.   Chest x-ray - n/a EKG - DOS 01/27/2023 Stress Test - Denies ECHO - Denies Cardiac Cath - Denies  Sleep study/sleep apnea/CPAP: Denies  Type II Diabetic Fasting Blood Sugar - 117-120 Checks Blood Sugar every few weeks  Blood Thinner Instructions: Denies Aspirin Instructions: Denies  ERAS Protcol - Yes, clear liquids until 3 hours prior to surgery  COVID TEST- Denies  Anesthesia review: No  Patient verbally denies any shortness of breath, fever, cough and chest pain during phone call   -------------  SDW INSTRUCTIONS given:  Your procedure is scheduled on Thursday, January 27, 2023.  Report to [facility] Main Entrance "A" at 0530 A.M., and check in at the Admitting office.  Call this number if you have problems the morning of surgery:  [phone]   Remember:  Do not eat after midnight the night before your surgery  You may drink clear liquids until 0430 the morning of your surgery.   Clear liquids allowed are: Water, Non-Citrus Juices (without pulp), Carbonated Beverages, Clear Tea, Black Coffee Only, and Gatorade   Take these medicines the morning of surgery with A SIP OF WATER  Atorvastatin, Keflex, Synthroid, Mybetriq  As needed: Flonase, Norco  As of today, STOP taking any Aspirin (unless otherwise instructed by your surgeon) Aleve, Naproxen, Ibuprofen, Motrin, Advil, Goody's, BC's, all herbal medications, fish oil, and all vitamins.  This includes your Diclofenac (Voltaren).    WHAT DO I DO ABOUT MY DIABETES MEDICATION?   Do not take oral diabetes medicines (pills) the morning of surgery. DO NOT take SitaGLIPtin-MetFORMIN HCL (JANUMET XR) the morning of surgery.   The day of surgery, do not take other  diabetes injectables, including Byetta (exenatide), Bydureon (exenatide ER), Victoza (liraglutide), or Trulicity (dulaglutide).  If your CBG is greater than 220 mg/dL, you may take  of your sliding scale (correction) dose of insulin.   HOW TO MANAGE YOUR DIABETES BEFORE AND AFTER SURGERY  Why is it important to control my blood sugar before and after surgery? Improving blood sugar levels before and after surgery helps healing and can limit problems. A way of improving blood sugar control is eating a healthy diet by:  Eating less sugar and carbohydrates  Increasing activity/exercise  Talking with your doctor about reaching your blood sugar goals High blood sugars (greater than 180 mg/dL) can raise your risk of infections and slow your recovery, so you will need to focus on controlling your diabetes during the weeks before surgery. Make sure that the doctor who takes care of your diabetes knows about your planned surgery including the date and location.  How do I manage my blood sugar before surgery? Check your blood sugar at least 4 times a day, starting 2 days before surgery, to make sure that the level is not too high or low.  Check your blood sugar the morning of your surgery when you wake up and every 2 hours until you get to the Short Stay unit.  If your blood sugar is less than 70 mg/dL, you will need to treat for low blood sugar: Do not take insulin. Treat a low blood sugar (less than 70 mg/dL) with  cup of clear juice (cranberry or apple), 4 glucose tablets, OR glucose gel. Recheck blood sugar in  15 minutes after treatment (to make sure it is greater than 70 mg/dL). If your blood sugar is not greater than 70 mg/dL on recheck, call [phone] for further instructions. Report your blood sugar to the short stay nurse when you get to Short Stay.  If you are admitted to the hospital after surgery: Your blood sugar will be checked by the staff and you will probably be given insulin  after surgery (instead of oral diabetes medicines) to make sure you have good blood sugar levels. The goal for blood sugar control after surgery is 80-180 mg/dL.           Do NOT Smoke (Tobacco/Vaping) 24 hours prior to your procedure  If you use a CPAP at night, you may bring all equipment for your overnight stay.   Contacts, glasses, dentures or bridgework may not be worn into surgery.      For patients admitted to the hospital, discharge time will be determined by your treatment team.   Patients discharged the day of surgery will not be allowed to drive home, and someone needs to stay with them for 24 hours.    Special instructions:   [facility]- Preparing For Surgery  Before surgery, you can play an important role. Because skin is not sterile, your skin needs to be as free of germs as possible. You can reduce the number of germs on your skin by washing with Dial Soap before surgery.     Oral Hygiene is also important to reduce your risk of infection.  Remember - BRUSH YOUR TEETH THE MORNING OF SURGER  Please follow these instructions carefully.   Shower the NIGHT BEFORE SURGERY and the MORNING OF SURGERY with DIAL Soap.   Pat yourself dry with a CLEAN TOWEL.  Wear CLEAN PAJAMAS to bed the night before surgery  Place CLEAN SHEETS on your bed the night of your first shower and DO NOT SLEEP WITH PETS.   Day of Surgery: Please shower morning of surgery  Wear Clean/Comfortable clothing the morning of surgery Do not apply any deodorants/lotions, colognes or deodorant.  You may shave the morning of surgery.  Remember to brush your teeth WITH YOUR REGULAR TOOTHPASTE.  Do not bring valuables to the hospital.   [facility] is not responsible for any belongings or valuables.    Questions were answered. Patient verbalized understanding of instructions.

## 2023-01-26 ENCOUNTER — Other Ambulatory Visit: Payer: Self-pay

## 2023-01-26 ENCOUNTER — Encounter (HOSPITAL_COMMUNITY): Payer: Self-pay | Admitting: Neurological Surgery

## 2023-01-26 NOTE — Anesthesia Preprocedure Evaluation (Signed)
Anesthesia Evaluation  Patient identified by MRN, date of birth, ID band Patient awake    Reviewed: Allergy & Precautions, NPO status , Patient's Chart, lab work & pertinent test results  Airway Mallampati: II  TM Distance: >3 FB Neck ROM: Full    Dental no notable dental hx.    Pulmonary neg pulmonary ROS   Pulmonary exam normal        Cardiovascular negative cardio ROS  Rhythm:Regular Rate:Normal     Neuro/Psych   Anxiety     Parkinsons  Neuromuscular disease    GI/Hepatic negative GI ROS, Neg liver ROS,,,  Endo/Other  diabetesHypothyroidism    Renal/GU negative Renal ROS  negative genitourinary   Musculoskeletal  (+) Arthritis , Osteoarthritis,  Cervical spondylolysis    Abdominal Normal abdominal exam  (+)   Peds  Hematology negative hematology ROS (+)   Anesthesia Other Findings   Reproductive/Obstetrics                             Anesthesia Physical Anesthesia Plan  ASA: 3  Anesthesia Plan: General   Post-op Pain Management:    Induction: Intravenous  PONV Risk Score and Plan: 2 and Ondansetron, Dexamethasone and Treatment may vary due to age or medical condition  Airway Management Planned: Mask and Oral ETT  Additional Equipment: None  Intra-op Plan:   Post-operative Plan: Extubation in OR  Informed Consent: I have reviewed the patients History and Physical, chart, labs and discussed the procedure including the risks, benefits and alternatives for the proposed anesthesia with the patient or authorized representative who has indicated his/her understanding and acceptance.     Dental advisory given  Plan Discussed with: CRNA  Anesthesia Plan Comments:        Anesthesia Quick Evaluation

## 2023-01-27 ENCOUNTER — Ambulatory Visit (HOSPITAL_COMMUNITY)
Admission: RE | Admit: 2023-01-27 | Discharge: 2023-01-27 | Disposition: A | Discharge status: Home / Self Care | Payer: Medicare Other | Attending: Neurological Surgery | Admitting: Neurological Surgery

## 2023-01-27 ENCOUNTER — Ambulatory Visit (HOSPITAL_BASED_OUTPATIENT_CLINIC_OR_DEPARTMENT_OTHER): Payer: Medicare Other | Admitting: Certified Registered Nurse Anesthetist

## 2023-01-27 ENCOUNTER — Ambulatory Visit (HOSPITAL_COMMUNITY): Payer: Medicare Other | Admitting: Certified Registered Nurse Anesthetist

## 2023-01-27 ENCOUNTER — Other Ambulatory Visit: Payer: Self-pay

## 2023-01-27 ENCOUNTER — Encounter (HOSPITAL_COMMUNITY)
Admission: RE | Disposition: A | Discharge status: Home / Self Care | Payer: Self-pay | Source: Home / Self Care | Attending: Neurological Surgery

## 2023-01-27 ENCOUNTER — Encounter (HOSPITAL_COMMUNITY): Payer: Self-pay | Admitting: Neurological Surgery

## 2023-01-27 DIAGNOSIS — E039 Hypothyroidism, unspecified: Secondary | ICD-10-CM

## 2023-01-27 DIAGNOSIS — Z79890 Hormone replacement therapy (postmenopausal): Secondary | ICD-10-CM | POA: Insufficient documentation

## 2023-01-27 DIAGNOSIS — G20A1 Parkinson's disease without dyskinesia, without mention of fluctuations: Secondary | ICD-10-CM | POA: Diagnosis not present

## 2023-01-27 DIAGNOSIS — M1990 Unspecified osteoarthritis, unspecified site: Secondary | ICD-10-CM | POA: Diagnosis not present

## 2023-01-27 DIAGNOSIS — E782 Mixed hyperlipidemia: Secondary | ICD-10-CM | POA: Insufficient documentation

## 2023-01-27 DIAGNOSIS — M1711 Unilateral primary osteoarthritis, right knee: Secondary | ICD-10-CM | POA: Diagnosis not present

## 2023-01-27 DIAGNOSIS — F419 Anxiety disorder, unspecified: Secondary | ICD-10-CM | POA: Diagnosis not present

## 2023-01-27 DIAGNOSIS — R251 Tremor, unspecified: Secondary | ICD-10-CM | POA: Diagnosis present

## 2023-01-27 DIAGNOSIS — Z791 Long term (current) use of non-steroidal anti-inflammatories (NSAID): Secondary | ICD-10-CM | POA: Diagnosis not present

## 2023-01-27 DIAGNOSIS — G20C Parkinsonism, unspecified: Secondary | ICD-10-CM | POA: Diagnosis not present

## 2023-01-27 DIAGNOSIS — M47812 Spondylosis without myelopathy or radiculopathy, cervical region: Secondary | ICD-10-CM

## 2023-01-27 DIAGNOSIS — E1149 Type 2 diabetes mellitus with other diabetic neurological complication: Secondary | ICD-10-CM | POA: Diagnosis not present

## 2023-01-27 DIAGNOSIS — Z7984 Long term (current) use of oral hypoglycemic drugs: Secondary | ICD-10-CM | POA: Insufficient documentation

## 2023-01-27 DIAGNOSIS — E119 Type 2 diabetes mellitus without complications: Secondary | ICD-10-CM | POA: Diagnosis not present

## 2023-01-27 HISTORY — PX: PULSE GENERATOR IMPLANT: SHX5370

## 2023-01-27 LAB — GLUCOSE, CAPILLARY
Glucose-Capillary: 149 mg/dL — ABNORMAL HIGH (ref 70–99)
Glucose-Capillary: 172 mg/dL — ABNORMAL HIGH (ref 70–99)

## 2023-01-27 SURGERY — UNILATERAL PULSE GENERATOR IMPLANT
Anesthesia: General | Site: Chest | Laterality: Left

## 2023-01-27 MED ORDER — ONDANSETRON HCL 4 MG/2ML IJ SOLN
INTRAMUSCULAR | Status: AC
  Filled 2023-01-27: qty 2

## 2023-01-27 MED ORDER — NEOSTIGMINE METHYLSULFATE 3 MG/3ML IV SOSY
PREFILLED_SYRINGE | INTRAVENOUS | Status: AC
  Filled 2023-01-27: qty 3

## 2023-01-27 MED ORDER — GLYCOPYRROLATE PF 0.2 MG/ML IJ SOSY
PREFILLED_SYRINGE | INTRAMUSCULAR | Status: AC
  Filled 2023-01-27: qty 1

## 2023-01-27 MED ORDER — HYDROCODONE-ACETAMINOPHEN 5-325 MG PO TABS: 1.0000 | ORAL_TABLET | Freq: Four times a day (QID) | ORAL | 0 refills | Status: DC | PRN

## 2023-01-27 MED ORDER — NEOSTIGMINE METHYLSULFATE 3 MG/3ML IV SOSY
PREFILLED_SYRINGE | INTRAVENOUS | Status: DC | PRN
  Administered 2023-01-27: 4.5 mg via INTRAVENOUS

## 2023-01-27 MED ORDER — FENTANYL CITRATE (PF) 250 MCG/5ML IJ SOLN
INTRAMUSCULAR | Status: DC | PRN
  Administered 2023-01-27: 25 ug via INTRAVENOUS
  Administered 2023-01-27: 100 ug via INTRAVENOUS
  Administered 2023-01-27: 25 ug via INTRAVENOUS

## 2023-01-27 MED ORDER — DEXAMETHASONE SODIUM PHOSPHATE 10 MG/ML IJ SOLN
INTRAMUSCULAR | Status: DC | PRN
  Administered 2023-01-27: 5 mg via INTRAVENOUS

## 2023-01-27 MED ORDER — FENTANYL CITRATE (PF) 100 MCG/2ML IJ SOLN
25.0000 ug | INTRAMUSCULAR | Status: DC | PRN
  Administered 2023-01-27: 50 ug via INTRAVENOUS

## 2023-01-27 MED ORDER — LIDOCAINE-EPINEPHRINE 1 %-1:100000 IJ SOLN
INTRAMUSCULAR | Status: DC | PRN
  Administered 2023-01-27: 5.5 mL

## 2023-01-27 MED ORDER — CHLORHEXIDINE GLUCONATE CLOTH 2 % EX PADS: 6.0000 | MEDICATED_PAD | Freq: Once | CUTANEOUS | Status: DC

## 2023-01-27 MED ORDER — LACTATED RINGERS IV SOLN: INTRAVENOUS | Status: DC

## 2023-01-27 MED ORDER — 0.9 % SODIUM CHLORIDE (POUR BTL) OPTIME
TOPICAL | Status: DC | PRN
  Administered 2023-01-27: 1000 mL

## 2023-01-27 MED ORDER — INSULIN ASPART 100 UNIT/ML IJ SOLN
0.0000 [IU] | INTRAMUSCULAR | Status: DC | PRN
  Administered 2023-01-27: 2 [IU] via SUBCUTANEOUS
  Filled 2023-01-27: qty 1

## 2023-01-27 MED ORDER — ROCURONIUM BROMIDE 10 MG/ML (PF) SYRINGE
PREFILLED_SYRINGE | INTRAVENOUS | Status: DC | PRN
  Administered 2023-01-27 (×2): 10 mg via INTRAVENOUS
  Administered 2023-01-27: 60 mg via INTRAVENOUS
  Administered 2023-01-27: 10 mg via INTRAVENOUS

## 2023-01-27 MED ORDER — LIDOCAINE 2% (20 MG/ML) 5 ML SYRINGE
INTRAMUSCULAR | Status: DC | PRN
  Administered 2023-01-27: 60 mg via INTRAVENOUS

## 2023-01-27 MED ORDER — PHENYLEPHRINE HCL-NACL 20-0.9 MG/250ML-% IV SOLN
INTRAVENOUS | Status: DC | PRN
  Administered 2023-01-27: 25 ug/min via INTRAVENOUS

## 2023-01-27 MED ORDER — CEFAZOLIN SODIUM-DEXTROSE 2-4 GM/100ML-% IV SOLN
2.0000 g | INTRAVENOUS | Status: AC
  Administered 2023-01-27: 2 g via INTRAVENOUS
  Filled 2023-01-27: qty 100

## 2023-01-27 MED ORDER — GLYCOPYRROLATE PF 0.2 MG/ML IJ SOSY
PREFILLED_SYRINGE | INTRAMUSCULAR | Status: DC | PRN
  Administered 2023-01-27: .8 mg via INTRAVENOUS

## 2023-01-27 MED ORDER — GLYCOPYRROLATE PF 0.2 MG/ML IJ SOSY
PREFILLED_SYRINGE | INTRAMUSCULAR | Status: AC
  Filled 2023-01-27: qty 2

## 2023-01-27 MED ORDER — ORAL CARE MOUTH RINSE: 15.0000 mL | Freq: Once | OROMUCOSAL | Status: AC

## 2023-01-27 MED ORDER — LIDOCAINE 2% (20 MG/ML) 5 ML SYRINGE
INTRAMUSCULAR | Status: AC
  Filled 2023-01-27: qty 5

## 2023-01-27 MED ORDER — ACETAMINOPHEN 10 MG/ML IV SOLN
INTRAVENOUS | Status: AC
  Filled 2023-01-27: qty 100

## 2023-01-27 MED ORDER — FENTANYL CITRATE (PF) 250 MCG/5ML IJ SOLN
INTRAMUSCULAR | Status: AC
  Filled 2023-01-27: qty 5

## 2023-01-27 MED ORDER — ACETAMINOPHEN 10 MG/ML IV SOLN
1000.0000 mg | Freq: Once | INTRAVENOUS | Status: DC | PRN
  Administered 2023-01-27: 1000 mg via INTRAVENOUS

## 2023-01-27 MED ORDER — PROPOFOL 10 MG/ML IV BOLUS
INTRAVENOUS | Status: DC | PRN
  Administered 2023-01-27: 50 mg via INTRAVENOUS
  Administered 2023-01-27: 160 mg via INTRAVENOUS
  Administered 2023-01-27: 50 mg via INTRAVENOUS
  Administered 2023-01-27: 40 mg via INTRAVENOUS

## 2023-01-27 MED ORDER — MIDAZOLAM HCL 2 MG/2ML IJ SOLN
INTRAMUSCULAR | Status: AC
  Filled 2023-01-27: qty 2

## 2023-01-27 MED ORDER — BACITRACIN ZINC 500 UNIT/GM EX OINT
TOPICAL_OINTMENT | CUTANEOUS | Status: AC
  Filled 2023-01-27: qty 28.35

## 2023-01-27 MED ORDER — ONDANSETRON HCL 4 MG/2ML IJ SOLN
INTRAMUSCULAR | Status: DC | PRN
  Administered 2023-01-27: 4 mg via INTRAVENOUS

## 2023-01-27 MED ORDER — VANCOMYCIN HCL 1000 MG IV SOLR
INTRAVENOUS | Status: AC
  Filled 2023-01-27: qty 20

## 2023-01-27 MED ORDER — ROCURONIUM BROMIDE 10 MG/ML (PF) SYRINGE
PREFILLED_SYRINGE | INTRAVENOUS | Status: AC
  Filled 2023-01-27: qty 10

## 2023-01-27 MED ORDER — LIDOCAINE-EPINEPHRINE 1 %-1:100000 IJ SOLN
INTRAMUSCULAR | Status: AC
  Filled 2023-01-27: qty 1

## 2023-01-27 MED ORDER — DEXAMETHASONE SODIUM PHOSPHATE 10 MG/ML IJ SOLN
INTRAMUSCULAR | Status: AC
  Filled 2023-01-27: qty 1

## 2023-01-27 MED ORDER — BUPIVACAINE HCL (PF) 0.5 % IJ SOLN
INTRAMUSCULAR | Status: AC
  Filled 2023-01-27: qty 30

## 2023-01-27 MED ORDER — CHLORHEXIDINE GLUCONATE 0.12 % MT SOLN
15.0000 mL | Freq: Once | OROMUCOSAL | Status: AC
  Administered 2023-01-27: 15 mL via OROMUCOSAL
  Filled 2023-01-27: qty 15

## 2023-01-27 MED ORDER — FENTANYL CITRATE (PF) 100 MCG/2ML IJ SOLN
INTRAMUSCULAR | Status: AC
  Filled 2023-01-27: qty 2

## 2023-01-27 MED ORDER — PROPOFOL 10 MG/ML IV BOLUS
INTRAVENOUS | Status: AC
  Filled 2023-01-27: qty 20

## 2023-01-27 MED ORDER — MIDAZOLAM HCL 5 MG/5ML IJ SOLN
INTRAMUSCULAR | Status: DC | PRN
  Administered 2023-01-27: 1 mg via INTRAVENOUS

## 2023-01-27 MED ORDER — VANCOMYCIN HCL 1000 MG IV SOLR
INTRAVENOUS | Status: DC | PRN
  Administered 2023-01-27: 1000 mg via TOPICAL

## 2023-01-27 MED ORDER — BUPIVACAINE HCL (PF) 0.5 % IJ SOLN
INTRAMUSCULAR | Status: DC | PRN
  Administered 2023-01-27: 5.5 mL

## 2023-01-27 SURGICAL SUPPLY — 49 items
ADH SKN CLS APL DERMABOND .7 (GAUZE/BANDAGES/DRESSINGS) ×1
BAG COUNTER SPONGE SURGICOUNT (BAG) ×1 IMPLANT
BAG SPNG CNTER NS LX DISP (BAG) ×1
BLADE CLIPPER SURG (BLADE) IMPLANT
BOOT SUTURE VASCULAR YLW (MISCELLANEOUS)
CANISTER SUCT 3000ML PPV (MISCELLANEOUS) ×1 IMPLANT
CLAMP SUTURE YELLOW 5 PAIRS (MISCELLANEOUS) IMPLANT
COVER BACK TABLE 60X90IN (DRAPES) ×1 IMPLANT
DERMABOND ADVANCED .7 DNX12 (GAUZE/BANDAGES/DRESSINGS) ×1 IMPLANT
DRAPE C-ARM 42X72 X-RAY (DRAPES) IMPLANT
DRAPE ORTHO SPLIT 77X108 STRL (DRAPES) ×2
DRAPE SURG ORHT 6 SPLT 77X108 (DRAPES) ×2 IMPLANT
DRSG OPSITE 4X5.5 SM (GAUZE/BANDAGES/DRESSINGS) IMPLANT
DRSG OPSITE POSTOP 3X4 (GAUZE/BANDAGES/DRESSINGS) ×2 IMPLANT
DRSG OPSITE POSTOP 4X6 (GAUZE/BANDAGES/DRESSINGS) IMPLANT
DURAPREP 26ML APPLICATOR (WOUND CARE) ×1 IMPLANT
ELECT COATED BLADE 2.86 ST (ELECTRODE) ×1 IMPLANT
GAUZE 4X4 16PLY ~~LOC~~+RFID DBL (SPONGE) IMPLANT
GAUZE SPONGE 4X4 12PLY STRL (GAUZE/BANDAGES/DRESSINGS) IMPLANT
GLOVE BIO SURGEON STRL SZ7 (GLOVE) ×2 IMPLANT
GLOVE BIOGEL PI IND STRL 7.0 (GLOVE) ×1 IMPLANT
GLOVE BIOGEL PI IND STRL 7.5 (GLOVE) ×1 IMPLANT
GLOVE SRG 8 PF TXTR STRL LF DI (GLOVE) ×1 IMPLANT
GLOVE SURG LTX SZ8 (GLOVE) ×1 IMPLANT
GLOVE SURG UNDER POLY LF SZ8 (GLOVE) ×2
GOWN STRL REUS W/ TWL XL LVL3 (GOWN DISPOSABLE) ×1 IMPLANT
GOWN STRL REUS W/TWL XL LVL3 (GOWN DISPOSABLE) ×1
KIT BASIN OR (CUSTOM PROCEDURE TRAY) ×1 IMPLANT
KIT CONTACT EXTENSION 55CMX8 (Miscellaneous) IMPLANT
KIT REMOVER STAPLE SKIN (MISCELLANEOUS) ×1 IMPLANT
MARKER SKIN DUAL TIP RULER LAB (MISCELLANEOUS) ×1 IMPLANT
NDL HYPO 25X1 1.5 SAFETY (NEEDLE) ×1 IMPLANT
NDL SPNL 18GX3.5 QUINCKE PK (NEEDLE) IMPLANT
NEEDLE HYPO 25X1 1.5 SAFETY (NEEDLE) ×1 IMPLANT
NEEDLE SPNL 18GX3.5 QUINCKE PK (NEEDLE) IMPLANT
PACK LAMINECTOMY NEURO (CUSTOM PROCEDURE TRAY) ×1 IMPLANT
PAD ARMBOARD 7.5X6 YLW CONV (MISCELLANEOUS) ×1 IMPLANT
PASSER CATH 36 CODMAN DISP (NEUROSURGERY SUPPLIES) ×1 IMPLANT
SHEATH PERITONEAL INTRO 46 (SHEATH) ×1 IMPLANT
SOL ELECTROSURG ANTI STICK (MISCELLANEOUS)
SOLUTION ELECTROSURG ANTI STCK (MISCELLANEOUS) ×1 IMPLANT
SPIKE FLUID TRANSFER (MISCELLANEOUS) ×1 IMPLANT
STAPLER VISISTAT 35W (STAPLE) ×1 IMPLANT
SUT ETHILON 3 0 PS 1 (SUTURE) IMPLANT
SUT SILK 2 0 PERMA HAND 18 BK (SUTURE) IMPLANT
SUT VIC AB 2-0 CP2 18 (SUTURE) ×1 IMPLANT
SUT VIC AB 3-0 SH 8-18 (SUTURE) ×1 IMPLANT
SUT VICRYL RAPIDE 4/0 PS 2 (SUTURE) ×1 IMPLANT
TAG SUTURE CLAMP YLW 5PR (MISCELLANEOUS)

## 2023-01-27 NOTE — Op Note (Signed)
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 Pieter Partridge C. Sidnee Gambrill, DO  ASSISTANT: None  ANESTHESIA: General Endotracheal  EBL: 50 cc  SPECIMENS: None  DRAINS: None  COMPLICATIONS: None  CONDITION: Hemodynamically stable  HISTORY: Maxwell Tate is a 67 y.o. male with a history of left sided STN DBS, who underwent right sided STN DBS lead placement approximately 2 weeks ago.  He elected to have this lead tunneled over to his left side.  He presented today for such.  Informed consent was obtained and witnessed.  We discussed tunneling along the left posterior cranial, soft tissue the neck and connecting to his prior IPG in the left chest.  PROCEDURE IN DETAIL: The patient was brought to the operating room. After induction of general anesthesia, the patient was positioned on the operative table in the supine position. All pressure points were meticulously padded.  The head was clipped free of hair along the left hemicrania.  His head was turned to the right to expose the left hemicrania.  Skin incision was then marked out and prepped and draped in the usual sterile fashion. Physician driven timeout was performed.  Local anesthetic was injected into the planned incisions.  Using 15 blade, the prior left chest incision was opened sharply down to the IPG.  The IPG was gently extricated from the pocket, and the prior lead was unscrewed and disconnected.  The IPG was kept in a sterile place and on the back table.  I then used a 15 blade to open sharply over the right STN lead that was tunneled to midline.  Using Bovie electrocautery, the galea was opened and the lead protector was identified.  This was gently removed from the wound.  I then used uterine packing forceps and  tunneled this posterior oh laterally along the posterior auricular region adjacent to the prior lead, making the new right sided lead closer towards midline.  Using 15 blade, a incision was made sharply down to the paracranium.  Hemostasis achieved with Bovie electrocautery.  The lead extension was then placed and final tightened to the manufacturer recommendation after removing the protective boot for the lead.  This was tunneled to the incision along the posterior auricular region.  Then using a tunneler, I tunneled through the cervical fascia and to the prior IPG pocket.  Tunneler was removed and sheath was remained.  The lead extension was then passed through the sheath to the IPG pocket.  Sheath was removed.  Both leads were then reconnected to the IPG and impedances were checked and noted to have excellent contact.  Soft tissue hemostasis achieved with bipolar cautery and all incisions.  The extra lead length was tucked behind the IPG and this was placed back in its original position.  Vancomycin was placed in the wounds.  The IPG pocket was then closed in layers, 2-0 and 3-0 Vicryl suture for dermis.  Skin was closed with skin glue.  A 2 cranial tunneling incisions were closed with 2-0 Vicryl sutures for the galea, skin was closed with staples.  I then opened the prior frontal fiducial incisions by removing the sutures sharply, and opening the prior incision with Metzenbaum scissors.  The fiducials were removed without difficulty.  These 2 wounds were closed with staples.  Sterile dressings were  applied.  At the end of the case all sponge, needle, and instrument counts were correct. The patient was then transferred to the stretcher, extubated, and taken to the post-anesthesia care unit in stable hemodynamic condition.

## 2023-01-27 NOTE — Transfer of Care (Signed)
Immediate Anesthesia Transfer of Care Note  Patient: Maxwell Tate  Procedure(s) Performed: Left Implantable pulse generator placement (Left: Chest)  Patient Location: PACU  Anesthesia Type:General  Level of Consciousness: awake, alert , and oriented  Airway & Oxygen Therapy: Patient Spontanous Breathing and Patient connected to nasal cannula oxygen  Post-op Assessment: Report given to RN, Post -op Vital signs reviewed and stable, Patient moving all extremities X 4, and Patient able to stick tongue midline  Post vital signs: Reviewed  Last Vitals:  Vitals Value Taken Time  BP 130/80 01/27/23 0942  Temp 36.5 C 01/27/23 0943  Pulse 60 01/27/23 0943  Resp 16 01/27/23 0943  SpO2 99 % 01/27/23 0943  Vitals shown include unvalidated device data.  Last Pain:  Vitals:   01/27/23 0630  TempSrc:   PainSc: 0-No pain      Patients Stated Pain Goal: 0 ([id number])  Complications:  Encounter Notable Events  Notable Event Outcome Phase Comment  Difficult to intubate - unexpected  Intraprocedure Filed from anesthesia note documentation.

## 2023-01-27 NOTE — Anesthesia Procedure Notes (Addendum)
Procedure Name: Intubation Date/Time: 01/27/2023 7:49 AM  Performed by: Maude Leriche, CRNAPre-anesthesia Checklist: Patient identified, Emergency Drugs available, Suction available and Patient being monitored Patient Re-evaluated:Patient Re-evaluated prior to induction Oxygen Delivery Method: Circle system utilized Preoxygenation: Pre-oxygenation with 100% oxygen Induction Type: IV induction Ventilation: Mask ventilation without difficulty Laryngoscope Size: Glidescope and 3 Tube type: Oral Tube size: 7.5 mm Number of attempts: 2 Airway Equipment and Method: Stylet, Video-laryngoscopy and Bite block Placement Confirmation: ETT inserted through vocal cords under direct vision, positive ETCO2 and breath sounds checked- equal and bilateral Secured at: 23 cm Tube secured with: Tape Dental Injury: Teeth and Oropharynx as per pre-operative assessment  Difficulty Due To: Difficulty was unanticipated and Difficult Airway- due to anterior larynx Comments: 1st attempt Grade III view w/Miller 2

## 2023-01-27 NOTE — Anesthesia Postprocedure Evaluation (Signed)
Anesthesia Post Note  Patient: AMAURY MASCIOLI  Procedure(s) Performed: Left Implantable pulse generator placement (Left: Chest)     Patient location during evaluation: PACU Anesthesia Type: General Level of consciousness: awake and alert Pain management: pain level controlled Vital Signs Assessment: post-procedure vital signs reviewed and stable Respiratory status: spontaneous breathing, nonlabored ventilation, respiratory function stable and patient connected to nasal cannula oxygen Cardiovascular status: blood pressure returned to baseline and stable Postop Assessment: no apparent nausea or vomiting Anesthetic complications: yes   Encounter Notable Events  Notable Event Outcome Phase Comment  Difficult to intubate - unexpected  Intraprocedure Filed from anesthesia note documentation.    Last Vitals:  Vitals:   01/27/23 1015 01/27/23 1030  BP: 106/69 121/77  Pulse: (!) 51 (!) 54  Resp: 10 14  Temp:  36.5 C  SpO2: 94% 92%    Last Pain:  Vitals:   01/27/23 0943  TempSrc:   PainSc: Asleep                 March Rummage Adeola Dennen

## 2023-01-27 NOTE — H&P (Signed)
Providing Compassionate, Quality Care - Together  NEUROSURGERY HISTORY & PHYSICAL   Maxwell Tate is an 67 y.o. male.   Chief Complaint: Left tremor HPI: This is a pleasant 67 year old male, status post right STN lead placement for left upper extremity tremor.  He presents today for connection to his IPG.  He wanted this tunneled to the left side due to his hobby of hunting.  At this time he has no complaints.  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   COVID 2021   cough only   History of kidney stones    Mixed hyperlipidemia    Neck pain 05/22/2020   Parkinson's disease    Personal history of colonic polyps    Primary hypothyroidism    Spinal stenosis, cervical region 05/22/2020   Type 2 diabetes mellitus without complication    Vitamin D deficiency     Past Surgical History:  Procedure Laterality Date   CARPAL TUNNEL RELEASE  2000   two weeks apart   HEMORRHOID SURGERY  2015   HEMORRHOID SURGERY  2005   KNEE SURGERY  2006   MINOR PLACEMENT OF FIDUCIAL N/A 12/01/2021   Procedure: Fiducial placement;  Surgeon: Karsten Ro, DO;  Location: [place];  Service: Neurosurgery;  Laterality: N/A;  MINOR ROOM   PULSE GENERATOR IMPLANT Left 12/17/2021   Procedure: Electrode Extension and Placement of Pulse Generator for Deep Brain Stimulator;  Surgeon: Zarek Relph, Theodoro Doing, DO;  Location: [place];  Service: Neurosurgery;  Laterality: Left;   RADIOLOGY WITH ANESTHESIA N/A 10/29/2021   Procedure: MRI BRAIN WITH AND WITHOUT CONTRAST WITH ANESTHESIA;  Surgeon: Radiologist, Medication, MD;  Location: [place];  Service: Radiology;  Laterality: N/A;   REPLACEMENT TOTAL KNEE  2021   RHINOPLASTY  1996   ROTATOR CUFF REPAIR  2006   ROTATOR CUFF REPAIR  2009   SUBTHALAMIC STIMULATOR INSERTION Left 12/10/2021   Procedure: Deep brain stimulator placement;  Surgeon: Oziel Beitler, Theodoro Doing, DO;  Location: [place];  Service:  Neurosurgery;  Laterality: Left;   SUBTHALAMIC STIMULATOR INSERTION Right 01/13/2023   Procedure: Right Deep brain stimulator placement;  Surgeon: Jamielynn Wigley, Theodoro Doing, DO;  Location: [place];  Service: Neurosurgery;  Laterality: Right;  RM 21   TESTICLE REMOVAL  2012    Family History  Problem Relation Age of Onset   Breast cancer Mother 76   Diabetic kidney disease Mother 17   Heart disease Father    Dementia Father        onset during mid to late 30s   Diabetes Sister    Lymphoma Brother    Parkinson's disease Maternal Grandfather    Crohn's disease Son    Social History:  reports that he has never smoked. He has never used smokeless tobacco. He reports that he does not currently use alcohol. He reports that he does not use drugs.  Allergies: No Known Allergies  Medications Prior to Admission  Medication Sig Dispense Refill   cephALEXin (KEFLEX) 500 MG capsule Take 1 capsule (500 mg total) by mouth 2 (two) times daily for 20 days. 20 capsule 0   cholecalciferol (VITAMIN D3) 25 MCG (1000 UNIT) tablet Take 2,000 Units by mouth in the morning.     fluticasone (FLONASE) 50 MCG/ACT nasal spray Place 1 spray into both nostrils daily as needed for rhinitis.     HYDROcodone-acetaminophen (NORCO/VICODIN) 5-325 MG tablet  Take 1 tablet by mouth every 6 (six) hours as needed for moderate pain. 30 tablet 0   levothyroxine (SYNTHROID) 100 MCG tablet Take 100 mcg by mouth every morning.     Multiple Vitamin (MULTIVITAMIN WITH MINERALS) TABS tablet Take 2 tablets by mouth daily.     Multiple Vitamins-Minerals (PRESERVISION AREDS 2) CAPS Take 1 capsule by mouth daily.     MYRBETRIQ 25 MG TB24 tablet Take 25 mg by mouth in the morning.     testosterone cypionate (DEPOTESTOSTERONE CYPIONATE) 200 MG/ML injection INJECT 1 ML INTRAMUSCULARLY EVERY 2 WEEKS     atorvastatin (LIPITOR) 40 MG tablet Take 40 mg by mouth in the morning.     Carbidopa-Levodopa ER (SINEMET CR) 25-100 MG tablet controlled release 1 po  tid at 7am/11am/4pm (Patient not taking: Reported on 12/30/2022) 270 tablet 0   diclofenac (VOLTAREN) 75 MG EC tablet Take 75 mg by mouth daily.     diphenhydrAMINE (BENADRYL) 25 MG tablet Take 25 mg by mouth at bedtime as needed for sleep. [facility] sleepaid     SitaGLIPtin-MetFORMIN HCl (JANUMET XR) 50-1000 MG TB24 Take 1 tablet by mouth daily.      Results for orders placed or performed during the hospital encounter of 01/27/23 (from the past 48 hour(s))  Glucose, capillary     Status: Abnormal   Collection Time: 01/27/23  6:10 AM  Result Value Ref Range   Glucose-Capillary 149 (H) 70 - 99 mg/dL    Comment: Glucose reference range applies only to samples taken after fasting for at least 8 hours.   No results found.  ROS All pertinent positives and negatives are listed HPI above  Blood pressure (!) 129/91, pulse (!) 58, temperature 98.1 F (36.7 C), temperature source Oral, resp. rate 16, height 5\' 9"  (1.753 m), weight 91 kg, SpO2 96 %. Physical Exam  Awake alert oriented x 3, no acute distress Speech fluent and appropriate Face symmetric Cranial nerves II through XII intact PERRLA Upper and lower extremities full strength throughout, symmetric Incisions are well-healed  Assessment/Plan 67 year old male with  Left parkinsonian tremor  -Or today for connection of his right STN lead placement to his left IPG.  We discussed all risks, benefits and expected outcomes as well as alternatives to treatment.  Informed consent was obtained and witnessed.  Thank you for allowing me to participate in this patient's care.  Please do not hesitate to call with questions or concerns.   Elwin Sleight, [facility] Cell: [phone]

## 2023-01-27 NOTE — Progress Notes (Signed)
Wasted Fentanyl 44mcg with Carolin Sicks, RN as witness.

## 2023-01-27 NOTE — Progress Notes (Signed)
Dr. Gloris Manchester made aware of and reviewed patient's EKG that was done this morning.

## 2023-01-31 ENCOUNTER — Encounter (HOSPITAL_COMMUNITY): Payer: Self-pay | Admitting: Neurological Surgery

## 2023-02-11 NOTE — Progress Notes (Unsigned)
Assessment/Plan:   1.  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    -Right STN DBS was completed January 13, 2023.  Lead is malpositioned  -we will re-order pre-op MRI brain with and without but will need to be done on 1.5 T machine given he has current IPG in place.  Pt reports that needs to be done under anesthesia as last time we tried with oral valium and it didn't help and needs general.    2.  RBD  -Safety discussed.  He still doesn't want medication for that.  Subjective:   Maxwell HawthorneKeith K Tate was seen today in follow up postoperatively right STN DBS on January 13, 2023.  During the procedure, optimal positioning of the lead was not obtained.  Patient desired to leave the lead and see if there was any chance it was programmable.  IPG was placed March 21.  Current movement disorder medications: None    PREVIOUS MEDICATIONS: Carbidopa/levodopa 25/250; entacapone (stopped after surgery); carbidopa/levodopa 25/100 CR (discontinued after surgery but then restarted); pramipexole  ALLERGIES:  No Known Allergies  CURRENT MEDICATIONS:  Outpatient Encounter Medications as of 02/14/2023  Medication Sig   atorvastatin (LIPITOR) 40 MG tablet Take 40 mg by mouth in the morning.   Carbidopa-Levodopa ER (SINEMET CR) 25-100 MG tablet controlled release 1 po tid at 7am/11am/4pm (Patient not taking: Reported on 12/30/2022)   cholecalciferol (VITAMIN D3) 25 MCG (1000 UNIT) tablet Take 2,000 Units by mouth in the morning.   diclofenac (VOLTAREN) 75 MG EC tablet Take 75 mg by mouth daily.   diphenhydrAMINE (BENADRYL) 25 MG tablet Take 25 mg by mouth at bedtime as needed for sleep. [facility] sleepaid   fluticasone (FLONASE) 50 MCG/ACT nasal spray Place 1 spray into both nostrils daily as needed for rhinitis.   HYDROcodone-acetaminophen (NORCO/VICODIN) 5-325 MG tablet Take 1 tablet by mouth every 6 (six) hours as needed for moderate pain.    levothyroxine (SYNTHROID) 100 MCG tablet Take 100 mcg by mouth every morning.   Multiple Vitamin (MULTIVITAMIN WITH MINERALS) TABS tablet Take 2 tablets by mouth daily.   Multiple Vitamins-Minerals (PRESERVISION AREDS 2) CAPS Take 1 capsule by mouth daily.   MYRBETRIQ 25 MG TB24 tablet Take 25 mg by mouth in the morning.   SitaGLIPtin-MetFORMIN HCl (JANUMET XR) 50-1000 MG TB24 Take 1 tablet by mouth daily.   testosterone cypionate (DEPOTESTOSTERONE CYPIONATE) 200 MG/ML injection INJECT 1 ML INTRAMUSCULARLY EVERY 2 WEEKS   No facility-administered encounter medications on file as of 02/14/2023.    Objective:   PHYSICAL EXAMINATION:    VITALS:   There were no vitals filed for this visit.  GEN:  The patient appears stated age and is in NAD. HEENT:  Normocephalic, atraumatic.  The mucous membranes are moist. The superficial temporal arteries are without ropiness or tenderness. CV:  RRR Lungs:  CTAB    Neurological examination:   Orientation: The patient is alert and oriented x3. Cranial nerves: There is good facial symmetry with mild facial hypomimia. The speech is fluent and clear. Soft palate rises symmetrically and there is no tongue deviation. Hearing is intact to conversational tone. Sensation: Sensation is intact to light touch throughout Motor: Strength is at least antigravity x4.  Tone: Normal Abnormal movements: there is LUE rest tremor, mod Coordination: there is decreased RAMs on the L, with any form of RAMS, including alternating supination and pronation of the forearm, hand opening and  closing, finger taps, heel taps and toe taps.  Gait and Station: The patient easily arises out of the chair.  Negative pull test.  There is decreased arm swing on the left.  He is ambulating well today  Total time spent on today's visit was ***  minutes, including both face-to-face time and nonface-to-face time.  Time included that spent on review of records (prior notes available to  me/labs/imaging if pertinent), discussing treatment and goals, answering patient's questions and coordinating care.  This is independent of DBS time.   Cc:  Blanchard Kelch, FNP

## 2023-02-14 ENCOUNTER — Encounter: Payer: Self-pay | Admitting: Neurology

## 2023-02-14 ENCOUNTER — Telehealth: Payer: Self-pay | Admitting: Neurology

## 2023-02-14 ENCOUNTER — Ambulatory Visit: Payer: Medicare Other | Admitting: Neurology

## 2023-02-14 VITALS — BP 131/86 | HR 67 | Ht 69.0 in | Wt 204.4 lb

## 2023-02-14 DIAGNOSIS — G20A1 Parkinson's disease without dyskinesia, without mention of fluctuations: Secondary | ICD-10-CM | POA: Diagnosis not present

## 2023-02-14 NOTE — Telephone Encounter (Signed)
Chelsea, please call the patient's wife.  She tried to tell us in private today something about the patient's personality being different.  She mentioned that it was that way ever since the first surgery, but had never previously mentioned that, which would have been very important for [place] to know.  When we tried to get details, the patient walked in the room and she did not want to further talk to [place].  We were going to talk to her afterward, but he did not want her to talk to [place] without her present.  Sometimes, the DBS can stimulate in various areas of the brain and cause patients to have manic behavior, but I am not sure that is what she is describing because we did not have enough time to talk.  I know she was saying that it happened after the first surgery and not the most recent surgery.  Could you please call her and get some details so that we can understand what is going on and see if we think it is related to the surgery or not.  We can also turn off the DBS for a few weeks and see how his personality and behavior manifest.  We know it is not medicine, since he is on no Parkinson's medication currently.

## 2023-02-14 NOTE — Procedures (Signed)
DBS Programming was performed.    Manufacturer of DBS device: [employer]  Total time spent programming was 90 minutes.  Device was confirmed on on the left and turned on on the right.  Soft start was confirmed to be on.  Impedences were checked and were within normal limits.  Battery was checked and was determined to be functioning normally and not near the end of life.  Final settings were as follows:    Active Contact Amplitude (mA) PW (ms) Frequency (hz) Side Effects  Left Brain       01/04/22 (5/6/7)-33%each C+ 3.0 60 159   01/18/22 (5/6/7)-33%each C+ 3.1 60 159   06/17/22 (5/6/7)-33%each C+ 3.1 60 159   10/19/22 (5/6/7)-33%each C+ 3.1 60 159   02/14/23 (3a/b/c)-C+ 3.3 60 179                 Right Brain       02/14/23 C-(87%)2A(13%)(2b/c)+ 4.5 80 179            Other trials: (5/6)-C+ 2.7 70/154 (thumb numb)  (5/6)-(2/3)-C+  3.6 70/154 (poor tremor control)  (5/6)-8+C+  4.0  70/170 (poor tremor control)  (2b/2c)-2A+C+ 60/159 (2b/c)-(3b/c)+ 60/159 C-(2b/c)+ 4.7 60/159

## 2023-02-15 NOTE — Telephone Encounter (Signed)
Called patient's wife and left voice mail message.

## 2023-02-17 NOTE — Telephone Encounter (Signed)
Patient's wife is returning a call.  

## 2023-02-18 NOTE — Telephone Encounter (Signed)
Great news.

## 2023-02-18 NOTE — Telephone Encounter (Signed)
Talked to patients wife she said you had answered her questions about the surgery being shorter this time do to the patient already being through it once. She said the tremor looked bad in the office but when they got home she ttook videos of patient holding cell phone and also sitting with arm on the couch and no tremors. She said she was tickled with the progress and said he looks great. Mood wise she said she thinks he is just frustrated but doing well

## 2023-02-28 ENCOUNTER — Encounter: Payer: Self-pay | Admitting: Neurology

## 2023-02-28 ENCOUNTER — Ambulatory Visit: Payer: Medicare Other | Admitting: Neurology

## 2023-02-28 VITALS — BP 118/78 | HR 63 | Ht 69.0 in | Wt 209.8 lb

## 2023-02-28 DIAGNOSIS — G20A1 Parkinson's disease without dyskinesia, without mention of fluctuations: Secondary | ICD-10-CM

## 2023-02-28 MED ORDER — CARBIDOPA-LEVODOPA 25-100 MG PO TABS: 1.0000 | ORAL_TABLET | Freq: Three times a day (TID) | ORAL | 1 refills | Status: AC

## 2023-02-28 NOTE — Progress Notes (Signed)
Assessment/Plan:   1.  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    -Right STN DBS was completed January 13, 2023.  Lead is malpositioned and a bit lateral than desired.  However, he really has no lateral side effects, even though I pushed the stimulation up quite a bit today.  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  It is interesting that the lead appears lateral position and, but I really did not get any lateral side effects even with significantly increased stimulation 5.7 mA.  At this point in time, patient is not ready to commit to another surgery.  We talked about the possibilities of doing revision surgery both awake and possibly asleep, but that would have to be done at another institution like [facility].  -Refer for [facility] in [place]  -start carbidopa/levodopa 25/100, 1 po tid  2.  RBD  -Safety discussed.  He still doesn't want medication for that.  Subjective:   Maxwell Tate was seen today in follow up postoperatively right STN DBS on January 13, 2023.  Patient and wife state that his tremor really has been coming and going.  His balance has been worse.  He only had 1 fall, but he was stooping over and cooking hotdogs and then just could not get up and fell when he was bent over.  He had some other near falls, but never actually fell.  Current movement disorder medications: None (patient choice)    PREVIOUS MEDICATIONS: Carbidopa/levodopa 25/250; entacapone (stopped after surgery); carbidopa/levodopa 25/100 CR (discontinued after surgery but then restarted); pramipexole  ALLERGIES:  No Known Allergies  CURRENT MEDICATIONS:  Outpatient Encounter Medications as of 02/28/2023  Medication Sig   atorvastatin (LIPITOR) 40 MG tablet Take 40 mg by mouth in the morning.   cholecalciferol  (VITAMIN D3) 25 MCG (1000 UNIT) tablet Take 2,000 Units by mouth in the morning.   diclofenac (VOLTAREN) 75 MG EC tablet Take 75 mg by mouth daily.   diphenhydrAMINE (BENADRYL) 25 MG tablet Take 25 mg by mouth at bedtime as needed for sleep. [facility] sleepaid   fluticasone (FLONASE) 50 MCG/ACT nasal spray Place 1 spray into both nostrils daily as needed for rhinitis.   HYDROcodone-acetaminophen (NORCO/VICODIN) 5-325 MG tablet Take 1 tablet by mouth every 6 (six) hours as needed for moderate pain.   levothyroxine (SYNTHROID) 100 MCG tablet Take 100 mcg by mouth every morning.   Multiple Vitamin (MULTIVITAMIN WITH MINERALS) TABS tablet Take 2 tablets by mouth daily.   Multiple Vitamins-Minerals (PRESERVISION AREDS 2) CAPS Take 1 capsule by mouth daily.   MYRBETRIQ 25 MG TB24 tablet Take 25 mg by mouth in the morning.   SitaGLIPtin-MetFORMIN HCl (JANUMET XR) 50-1000 MG TB24 Take 1 tablet by mouth daily.   testosterone cypionate (DEPOTESTOSTERONE CYPIONATE) 200 MG/ML injection INJECT 1 ML INTRAMUSCULARLY EVERY 2 WEEKS   Carbidopa-Levodopa ER (SINEMET CR) 25-100 MG tablet controlled release 1 po tid at 7am/11am/4pm (Patient not taking: Reported on 02/28/2023)   No facility-administered encounter medications on file as of 02/28/2023.    Objective:   PHYSICAL EXAMINATION:    VITALS:   Vitals:   02/28/23 1413  BP: 118/78  Pulse: 63  SpO2: 98%  Weight: 209 lb 12.8 oz (95.2 kg)  Height:  (1.753 m)  GEN:  The patient appears stated age and is in NAD. HEENT:  Normocephalic, atraumatic.  The mucous membranes are moist. The superficial temporal arteries are without ropiness or tenderness. CV:  RRR Lungs:  CTAB  Neurological examination:   Orientation: The patient is alert and oriented x3. Cranial nerves: There is good facial symmetry with mild facial hypomimia. The speech is fluent and clear. Soft palate rises symmetrically and there is no tongue deviation. Hearing is intact to conversational  tone. Sensation: Sensation is intact to light touch throughout Motor: Strength is at least antigravity x4.  Tone: no rigidity bilaterally Abnormal movements: His left upper extremity rest tremor is very inconsistent and fluctuates throughout the entire visit.  At times it is not there at all, and other times it is moderate in amplitude, worse when he ambulates Coordination: there is no decremation, with any form of RAMS, including alternating supination and pronation of the forearm, hand opening and closing, finger taps, heel taps and toe taps.  Gait and Station: The patient arises cautiously out of the chair.  He is not shuffling and once he is in the hallway, he walks pretty well.   Cc:  Blanchard Kelch, FNP

## 2023-02-28 NOTE — Patient Instructions (Signed)
Start carbidopa/levodopa 25/100, 1 at 7am/11am/4pm

## 2023-02-28 NOTE — Procedures (Signed)
DBS Programming was performed.    Manufacturer of DBS device: [employer]  Total time spent programming was 60 minutes.  Multiple trials were attempted today, including changing polarity of device.   Device was confirmed on on the left and turned on on the right.  Soft start was confirmed to be on.  Impedences were checked and were within normal limits.  Battery was checked and was determined to be functioning normally and not near the end of life.  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

## 2023-03-01 ENCOUNTER — Encounter: Payer: Self-pay | Admitting: Neurology

## 2023-03-03 NOTE — Telephone Encounter (Signed)
Called patient and let him know that the doctor at [facility] is willing to do his sedated DBS patient has agreed

## 2023-03-04 ENCOUNTER — Other Ambulatory Visit: Payer: Self-pay

## 2023-03-04 DIAGNOSIS — Z9689 Presence of other specified functional implants: Secondary | ICD-10-CM

## 2023-03-04 NOTE — Telephone Encounter (Signed)
Referral sent 

## 2023-06-23 ENCOUNTER — Encounter: Payer: Self-pay | Admitting: Neurology

## 2023-07-01 NOTE — Progress Notes (Unsigned)
Assessment/Plan:   1.  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    -Right STN DBS was completed January 13, 2023.  Lead is malpositioned and a bit lateral than desired.  However, he really has no lateral side effects, even though I pushed the stimulation up quite a bit today.  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  It is interesting that the lead appears lateral position and, but I really did not get any lateral side effects even with significantly increased stimulation 5.7 mA.  Patient plans to have lead revision at [facility].  He states that they talk to him about completely sedated surgery versus sedation only during drilling.  I did remind him that he had no issues at all with anxiety during the drill process, but his issue really was the length of the surgery and how long he had to lay there.  He is going to talk to Dr. Carlyn Reichert further about this.  I have emailed Dr. Carlyn Reichert about this as well last week and am awaiting a response.  -Talk to him about charging, as he has let his battery get significantly depleted several times.  It is estimated that it should only take him about 1 hour/week.  -Needs to take his levodopa faithfully.  -It is a bit unclear if he is going to be following up with myself or [facility].  For now, we made him a follow-up appointment here.  2.  RBD  -Safety discussed.  He still doesn't want medication for that.  Subjective:   Maxwell Tate was seen today in follow up for Parkinsons disease.  He has been seen by Dr. Carlyn Reichert at [facility] since our last visit.  He was seen on May 20 and I reviewed the notes.  [facility] repeated his MRI imaging postop on June 24 and not surprisingly to his right deep brain stimulator was lateral compared to the left.  He did have a fall backward - he was throwing wood on  top of the stack and lost balance and fell.  He also fell while crouched down cooking a hot dog and fell.  He also fell while crouched down and putting together a deer stand.  Balance in general isn't as good.  He didn't go to PT because they did not specialize in Parkinson's.  He does go to the gym 3 days per week.  He is supposed to be on carbidopa/levodopa but admits he is only taking it "occasionally."  Current movement disorder medications: carbidopa/levodopa 25/100 tid   PREVIOUS MEDICATIONS: Carbidopa/levodopa 25/250; entacapone (stopped after surgery); carbidopa/levodopa 25/100 CR (discontinued after surgery but then restarted); pramipexole  ALLERGIES:  No Known Allergies  CURRENT MEDICATIONS:  Outpatient Encounter Medications as of 07/04/2023  Medication Sig   atorvastatin (LIPITOR) 40 MG tablet Take 40 mg by mouth in the morning.   carbidopa-levodopa (SINEMET IR) 25-100 MG tablet Take 1 tablet by mouth 3 (three) times daily. 7am/11am/4pm   cholecalciferol (VITAMIN D3) 25 MCG (1000 UNIT) tablet Take 2,000 Units by mouth in the morning.   fluticasone (FLONASE) 50 MCG/ACT nasal spray Place 1 spray into both nostrils daily as needed for rhinitis.   levothyroxine (SYNTHROID) 100 MCG tablet Take 100 mcg by mouth every morning.   Multiple Vitamin (MULTIVITAMIN WITH MINERALS) TABS tablet Take  2 tablets by mouth daily.   Multiple Vitamins-Minerals (PRESERVISION AREDS 2) CAPS Take 1 capsule by mouth daily.   MYRBETRIQ 25 MG TB24 tablet Take 25 mg by mouth in the morning.   SitaGLIPtin-MetFORMIN HCl (JANUMET XR) 50-1000 MG TB24 Take 1 tablet by mouth daily.   [DISCONTINUED] diclofenac (VOLTAREN) 75 MG EC tablet Take 75 mg by mouth daily.   [DISCONTINUED] diphenhydrAMINE (BENADRYL) 25 MG tablet Take 25 mg by mouth at bedtime as needed for sleep. [facility] sleepaid   [DISCONTINUED] HYDROcodone-acetaminophen (NORCO/VICODIN) 5-325 MG tablet Take 1 tablet by mouth every 6 (six) hours as needed for  moderate pain.   [DISCONTINUED] testosterone cypionate (DEPOTESTOSTERONE CYPIONATE) 200 MG/ML injection INJECT 1 ML INTRAMUSCULARLY EVERY 2 WEEKS   No facility-administered encounter medications on file as of 07/04/2023.    Objective:   PHYSICAL EXAMINATION:    VITALS:   Vitals:   07/04/23 1411  BP: 122/80  Pulse: 85  SpO2: 97%  Weight: 211 lb (95.7 kg)  Height: 5\' 9"  (1.753 m)     GEN:  The patient appears stated age and is in NAD. HEENT:  Normocephalic, atraumatic.  The mucous membranes are moist. The superficial temporal arteries are without ropiness or tenderness. CV:  RRR Lungs:  CTAB  Neurological examination:   Orientation: The patient is alert and oriented x3. Cranial nerves: There is good facial symmetry with mild facial hypomimia. The speech is fluent and clear. Soft palate rises symmetrically and there is no tongue deviation. Hearing is intact to conversational tone. Sensation: Sensation is intact to light touch throughout Motor: Strength is at least antigravity x4.  Tone: no rigidity bilaterally Abnormal movements: His left upper extremity rest tremor is very inconsistent and fluctuates throughout the entire visit.  At times it is not there at all, and other times it is moderate in amplitude, worse when he ambulates Coordination: there is no decremation, with any form of RAMS, including alternating supination and pronation of the forearm, hand opening and closing, finger taps, heel taps and toe taps.  Gait and Station: The patient is able to arise without using the hands.  He is short stepped and shuffling.  Total time spent on today's visit was 40 minutes, including both face-to-face time and nonface-to-face time.  Time included that spent on review of records (prior notes available to me/labs/imaging if pertinent), discussing treatment and goals, answering patient's questions and coordinating care.  This did not include time to interrogate the DBS  Cc:  Blanchard Kelch, FNP

## 2023-07-04 ENCOUNTER — Encounter: Payer: Self-pay | Admitting: Neurology

## 2023-07-04 ENCOUNTER — Ambulatory Visit (INDEPENDENT_AMBULATORY_CARE_PROVIDER_SITE_OTHER): Payer: Medicare Other | Admitting: Neurology

## 2023-07-04 VITALS — BP 122/80 | HR 85 | Ht 69.0 in | Wt 211.0 lb

## 2023-07-04 DIAGNOSIS — G20B2 Parkinson's disease with dyskinesia, with fluctuations: Secondary | ICD-10-CM

## 2023-07-04 DIAGNOSIS — G4752 REM sleep behavior disorder: Secondary | ICD-10-CM | POA: Diagnosis not present

## 2023-07-04 NOTE — Patient Instructions (Signed)
SAVE THE DATE!  We are planning a Parkinsons Disease educational symposium at [facility] in [place] on October 11.  More details to come!  If you would like to be added to our email list to get further information, email [email or web address].  We hope to see you there!  The physicians and staff at [facility] are committed to providing excellent care. You may receive a survey requesting feedback about your experience at our office. We strive to receive "very good" responses to the survey questions. If you feel that your experience would prevent you from giving the office a "very good " response, please contact our office to try to remedy the situation. We may be reached at [phone]. Thank you for taking the time out of your busy day to complete the survey.

## 2023-07-04 NOTE — Procedures (Signed)
DBS Programming was performed.    Manufacturer of DBS device: [employer]  Total time spent programming was 10 minutes.  Multiple trials were attempted today, including changing polarity of device.   Device was confirmed on.  Soft start was confirmed to be on.  Impedences were checked and were within normal limits.  Battery was checked and was determined to be functioning normally and not near the end of life.  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

## 2023-07-05 NOTE — Telephone Encounter (Signed)
It says permeantly closed when goggled so I called and it is only the admin offices for other locations

## 2023-07-08 ENCOUNTER — Encounter: Payer: Self-pay | Admitting: Neurology

## 2023-07-12 ENCOUNTER — Other Ambulatory Visit: Payer: Self-pay

## 2023-07-12 DIAGNOSIS — G20B2 Parkinson's disease with dyskinesia, with fluctuations: Secondary | ICD-10-CM

## 2023-11-25 ENCOUNTER — Encounter: Payer: Self-pay | Admitting: Neurology

## 2024-01-05 ENCOUNTER — Ambulatory Visit: Payer: Medicare Other | Admitting: Neurology

## 2024-02-22 NOTE — Progress Notes (Signed)
 [facility] 02/22/2024  Patient: Maxwell Tate MRN: [id number]   HPI (copied/reviewed/modified from prior): Maxwell Tate is a 68 y.o. right handed male with history of Parkinson's disease diagnosed around 13. He underwent L STN DBS 12/10/2021 with good improvement. He subsequently underwent R STN lead placement 01/13/2023 (both initial placements done outside the [facility] system), but did not experience benefit. He underwent revision of R STN DBS by Dr. Leellen at [facility] on 09/30/23. There were no notable complications during the perioperative period.   Last visit was 1 month ago with Bernice. Plan at that time was the following: Parkinson's disease - Adjust DBS settings to improve symptom control. 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 Possible sleep apnea not recently evaluated. Poor sleep quality can worsen memory and increase daytime fatigue. - Recommend taking 5 mg of melatonin at night, with the option to increase to 10 mg if needed. - Recommend sleep study to assess for sleep apnea, which can be arranged through primary care or local neurologist.   Back pain Pain localized to lower back without radiation. - Recommend evaluation by a back specialist or primary care provider for further assessment.  Since then, his tremor has been more manageable but still comes out intermittently. Right side remains well controlled (zero tremor).  Walking worsening over time. Primarily manifesting as freezing of gait. Worse when in crowded spaces, transitions, thresholds. Also worse with multitasking.  Started melatonin due to dream enactment behavior, which is helping.   Meds: Sinemet   25/100: 1 tab TID at 7AM, 12PM, 4-5PM.  Physical exam Vitals:   02/22/24 1109 02/22/24 1112  BP: 129/88 123/77  BP Location: Left upper arm Left upper arm  Patient Position: Sitting Standing  BP Cuff Size: Adult Adult  Pulse: 76 65  Weight:  97.1 kg (214 lb)  Height: 175.3 cm (5' 9) 175.3 cm (5' 9)      Constitutional: NAD Psych: normal affect   General neurologic examination: Mental Status: Awake, alert, and oriented to time, place and situation.  Speech is fluid with appropriate sentence construction.  Language and comprehension are grossly intact.  The patient provides all relevant details of history clearly and coherently.    Cranial Nerves: EOMI, no nystagmus. Facial activation is symmetrically intact. Hearing is grossly intact.   Motor: moving extremities against gravity  Coordination: no dysmetria on FNF, no truncal ataxia  Movement disorders examination (ON meds, last sinemet  10 minutes ago): Speech and facial expression: mild hypomimia  Tremor: moderate intermittent LUE rest tremor Tone: normal Bradykinesia: slight throughout except mild in LLE Dyskinesia/Chorea: none Dystonia: none Gait: freezing in doorways and on turns (brief, primarily in left foot).  DBS Programming DBS implantation date: 09/30/23 R STN revision, 2023 (L STN), early 2024 (originial R STN) Target: STN IPG Type: [place] DBS Neurosurgeon: Harward  Therapy Measurements:  Battery 3.9 - rechargeable   Left Right  Electrode impedances wnl wnl   Initial Setting Grp L contact mA PW F Limits R contact mA PW F Limits  1 C+3- 3.3 60 159 2.4-3.4 C+3- 3.2 60 159 0-3.5  2 C+3- 3.3 60 159 2.4-3.4 C+ 2b (38%) 2c (38%) 4- (23%) 2.5 60 159  0-3.3  3* C+3- 3.4 60 159 2.4-3.5 L2.5  posterior 2.7 60 159 0-3              *Active  Programming notes (all right STN) Copied program 3 to 4. Reduced frequency to 130Hz  bilaterally. Moved field distally Developed some paresthesias so reduced amplitude  to 2.5. resolved paresthesia. Tremor better at rest but still occurred with gait and with intermittent freezing.  Moved field proximally (L3) and increased amplitude. Worse.  Moved Distally (L1). Same issues. Tremor once freeze started.  Back to L2.5. Steered posterolaterally. No side effects. Increased amplitude slowly. Best gait/tremor control at 2.10mA. Higher amplitudes caused hand burning/paresthesia  Final Setting Grp L contact mA PW F Limits R contact mA PW F Limits  1 C+3- 3.3 60 159 2.4-3.4 C+3- 3.2 60 159 0-3.5  2 C+3- 3.3 60 159 2.4-3.4 C+ 2b (38%) 2c (38%) 4- (23%) 2.5 60 159 0-3.3  3 C+3- 3.4 60 159 2.4-3.5 L2.2 posterior 2.5 60 159 0-3  4* C+3- 3.4 60 130 2.4-3.5 L2.5 pos-lat 2.8 60 130 0-3  *Active  Time spent programming: 25 minutes  Reviewed how to use the home programmer.  He should charge for 1 hr each week.  ASSESSMENT Maxwell Tate  is a 68 y.o. male with history of PD diagnosed in 2015, s/p bilateral STN DBS (left STN 2023, right STN 2024) who underwent R lead revision of right STN DBS with Dr. Leellen 09/30/23.   He has shown significant improvement in left sided symptoms compared to my initial visit with him (pre-revision). I performed further DBS programming as above to address residual left sided symptoms.  Unclear if his freezing of gait is responsive to meds/DBS but I have concerns it may be refractory to both. Given some concurrent cognitive symptoms (name finding) and gait worse when distracted/multitasking, I recommended trial of donepezil daily.  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  PLAN Parkinson's disease without dyskinesia, with fluctuating manifestations DBS as above Same sinemet   Cognitive changes, freezing of gait Donepezil 5mg  daily  REM behavioral disorder Cont melatonin Qhs  Follow up 3 months with Erika Jacumin. Ok to transfer care back to Dr. Evonnie in [place] whenever patient ready. Happy to see him in the  meantime.  As a separate billable procedure, I performed DBS programming for 25 minutes  02/22/2024 [name], MD  Associate Professor of Neurology  [facility] [address], [place] [address] Phone [phone], fax [phone]
